# Patient Record
Sex: Female | Born: 1980 | Hispanic: No | Marital: Married | Smoking: Never smoker
Health system: Southern US, Community
[De-identification: ages and names within clinical notes are randomized; demographics above are authoritative.]

## PROBLEM LIST (undated history)

## (undated) DIAGNOSIS — R51 Headache: Secondary | ICD-10-CM

## (undated) DIAGNOSIS — T7840XA Allergy, unspecified, initial encounter: Secondary | ICD-10-CM

## (undated) DIAGNOSIS — R519 Headache, unspecified: Secondary | ICD-10-CM

## (undated) DIAGNOSIS — Z800 Family history of malignant neoplasm of digestive organs: Secondary | ICD-10-CM

## (undated) DIAGNOSIS — Z8051 Family history of malignant neoplasm of kidney: Secondary | ICD-10-CM

## (undated) HISTORY — DX: Family history of malignant neoplasm of kidney: Z80.51

## (undated) HISTORY — DX: Headache, unspecified: R51.9

## (undated) HISTORY — DX: Allergy, unspecified, initial encounter: T78.40XA

## (undated) HISTORY — DX: Headache: R51

## (undated) HISTORY — DX: Family history of malignant neoplasm of digestive organs: Z80.0

---

## 2016-04-25 ENCOUNTER — Encounter: Payer: Self-pay | Admitting: Certified Nurse Midwife

## 2016-04-25 ENCOUNTER — Ambulatory Visit (INDEPENDENT_AMBULATORY_CARE_PROVIDER_SITE_OTHER): Payer: PRIVATE HEALTH INSURANCE | Admitting: Certified Nurse Midwife

## 2016-04-25 VITALS — BP 116/72 | HR 72 | Resp 16 | Ht 67.25 in | Wt 138.0 lb

## 2016-04-25 DIAGNOSIS — M791 Myalgia, unspecified site: Secondary | ICD-10-CM

## 2016-04-25 DIAGNOSIS — N390 Urinary tract infection, site not specified: Secondary | ICD-10-CM

## 2016-04-25 DIAGNOSIS — Z0000 Encounter for general adult medical examination without abnormal findings: Secondary | ICD-10-CM

## 2016-04-25 LAB — POCT URINALYSIS DIPSTICK
BILIRUBIN UA: NEGATIVE
Glucose, UA: NEGATIVE
KETONES UA: NEGATIVE
Leukocytes, UA: NEGATIVE
Nitrite, UA: NEGATIVE
PH UA: 5
Protein, UA: NEGATIVE
RBC UA: NEGATIVE
Urobilinogen, UA: NEGATIVE

## 2016-04-25 MED ORDER — PHENAZOPYRIDINE HCL 100 MG PO TABS: 100.0000 mg | ORAL_TABLET | Freq: Three times a day (TID) | ORAL | 0 refills | Status: DC | PRN

## 2016-04-25 MED ORDER — NITROFURANTOIN MONOHYD MACRO 100 MG PO CAPS: 100.0000 mg | ORAL_CAPSULE | Freq: Two times a day (BID) | ORAL | 0 refills | Status: DC

## 2016-04-25 NOTE — Progress Notes (Signed)
35 y.o. G1P1001 Married  [place] eastern([place]) Fe here to establish gyn care and for problem only. Complaining of urinary frequency, urgency and end of stream dysuria and small amounts of urine at times. Denies fever or chills,or back pain, nausea or headache. Has been in swimming recently and feels this may have contributed to problem. No soda or tea use. Drinking water frequently. Also complaining of right chest wall pain after trying the hola hoop 3 weeks ago, using arms. for several hours. Denies change in breast appearance  or nipple discharge. No masses palpated Has breast tenderness with period and this feels different. Does SBE and has noted change or tenderness. No family history of breast cancer. Periods normal, no issues. Condoms work well for contraception. No other concerns today. No communication concerns.  Patient's last menstrual period was 04/02/2016.          Sexually active: Yes.    The current method of family planning is condoms all the time.    Exercising: No.  exercise Smoker:  no  Health Maintenance: Pap:  2013 neg, no abnormal pap smears MMG:  none Colonoscopy:  None (father died of colon cancer age 29) BMD:   none TDaP:  unsure Shingles: no Pneumonia: no Hep C and HIV: had done neg Labs: poct urine-neg, hgb-12.8 Self breast exam: not done   reports that she has never smoked. She has never used smokeless tobacco. She reports that she does not drink alcohol or use drugs.  History reviewed. No pertinent past medical history.  History reviewed. No pertinent surgical history.  No current outpatient prescriptions on file.   No current facility-administered medications for this visit.     History reviewed. No pertinent family history.  ROS:  Pertinent items are noted in HPI.  Otherwise, a comprehensive ROS was negative.  Exam:   BP 116/72   Pulse 72   Resp 16   Ht 5' 7.25" (1.708 m)   Wt 138 lb (62.6 kg)   LMP 04/02/2016   BMI 21.45 kg/m  Height: 5' 7.25"  (170.8 cm) Ht Readings from Last 3 Encounters:  04/25/16 5' 7.25" (1.708 m)    General appearance: alert, cooperative and appears stated age Lungs: clear to auscultation bilaterally CVAT: negative bilateral Breasts: normal appearance, no masses or tenderness, No nipple retraction or dimpling, No nipple discharge or bleeding, No axillary or supraclavicular adenopathy  Patient points to area outside breast on chest wall as point of pain and has pain with range of motion using right arm in chest wall area. No point of pain in breast area bilateral. Heart: regular rate and rhythm Abdomen: soft, non-tender; no masses, positive suprapubic,  no organomegaly Skin: Skin color, texture, turgor normal. No rashes or lesions, warm and dry Lymph nodes:  Axillary nodes normal. No abnormal inguinal nodes palpated Neurologic: Grossly normal   Pelvic: External genitalia:  no lesions, normal female              Urethra:  normal appearing urethra with no masses, tender  Bladder tender, Urethral meatus slightly red and tender              Bartholin's and Skene's: normal                 Vagina: normal appearing vagina with normal color and discharge, no lesions              Cervix: palpated normal, non tender  Pap taken: No. Bimanual Exam:  Uterus:  normal size, contour, position, consistency, mobility, non-tender              Adnexa: normal adnexa and no mass, fullness, tenderness                 Chaperone present: yes  A:  UTI with urethritis  Right chest wall muscle pain, probably due to exercise activity  Normal breast exam  Aex needed  P:   Reviewed findings of UTI and need for treatment.  Discussed risk of UTI and vaginal infection with staying in wet swim suit for long periods of time. Continue to increase water intake. Warning signs of UTI reviewed and need to advise. Will follow up at aex.  Rx Macrobid see order with instructions  Rx Pyridium see order with instructions Lab:  Urine culture   Discussed muscular pain with some activities and area she points to is chest wall muscle pain, rib area. No tenderness identified in breast bilateral. Normal breast exam.. Discussed slow range of motion exercise, heat to area and trial of Advil 600 mg every 6 hours for 48 hours to see if this relieved the problem. No heavy lifting or straining. Warning signs of chest pain given and needs to seek ER. Voiced understanding.   Plans to schedule aex prior to leaving and will follow up with chest wall muscle pain at aex.  Rv prn  An After Visit Summary was printed and given to the patient.

## 2016-04-25 NOTE — Patient Instructions (Signed)

## 2016-04-27 LAB — URINE CULTURE: ORGANISM ID, BACTERIA: NO GROWTH

## 2016-04-28 LAB — HEMOGLOBIN, FINGERSTICK: HEMOGLOBIN, FINGERSTICK: 12.8 g/dL (ref 12.0–16.0)

## 2016-04-28 NOTE — Progress Notes (Signed)
Encounter reviewed Haroldine Redler, MD   

## 2016-04-29 ENCOUNTER — Telehealth: Payer: Self-pay

## 2016-04-29 NOTE — Telephone Encounter (Signed)
-----   Message from Verner Chol, CNM sent at 04/28/2016  8:18 PM EDT ----- Notify urine culture is negative so urethritis the cause of discomfort Patient status

## 2016-04-29 NOTE — Telephone Encounter (Signed)
lmtcb

## 2016-04-29 NOTE — Telephone Encounter (Signed)
Patient notified of results. See lab 

## 2017-05-06 ENCOUNTER — Ambulatory Visit (INDEPENDENT_AMBULATORY_CARE_PROVIDER_SITE_OTHER): Payer: BLUE CROSS/BLUE SHIELD | Admitting: Family Medicine

## 2017-05-06 ENCOUNTER — Encounter: Payer: Self-pay | Admitting: Family Medicine

## 2017-05-06 VITALS — BP 126/80 | HR 99 | Resp 12 | Ht 67.25 in | Wt 145.5 lb

## 2017-05-06 DIAGNOSIS — G44229 Chronic tension-type headache, not intractable: Secondary | ICD-10-CM | POA: Diagnosis not present

## 2017-05-06 DIAGNOSIS — J069 Acute upper respiratory infection, unspecified: Secondary | ICD-10-CM | POA: Diagnosis not present

## 2017-05-06 DIAGNOSIS — G8929 Other chronic pain: Secondary | ICD-10-CM | POA: Diagnosis not present

## 2017-05-06 DIAGNOSIS — M549 Dorsalgia, unspecified: Secondary | ICD-10-CM

## 2017-05-06 DIAGNOSIS — G4700 Insomnia, unspecified: Secondary | ICD-10-CM | POA: Diagnosis not present

## 2017-05-06 LAB — BASIC METABOLIC PANEL
BUN: 11 mg/dL (ref 6–23)
CHLORIDE: 105 meq/L (ref 96–112)
CO2: 30 meq/L (ref 19–32)
CREATININE: 0.93 mg/dL (ref 0.40–1.20)
Calcium: 10 mg/dL (ref 8.4–10.5)
GFR: 72.42 mL/min (ref >60.00)
Glucose, Bld: 95 mg/dL (ref 70–99)
Potassium: 4.3 mEq/L (ref 3.5–5.1)
SODIUM: 139 meq/L (ref 135–145)

## 2017-05-06 LAB — CBC
HCT: 39 % (ref 36.0–46.0)
HEMOGLOBIN: 12.9 g/dL (ref 12.0–15.0)
MCHC: 33.2 g/dL (ref 30.0–36.0)
MCV: 88.7 fl (ref 78.0–100.0)
Platelets: 173 10*3/uL (ref 150.0–400.0)
RBC: 4.39 Mil/uL (ref 3.87–5.11)
RDW: 13.7 % (ref 11.5–15.5)
WBC: 5.3 10*3/uL (ref 4.0–10.5)

## 2017-05-06 LAB — TSH: TSH: 2.62 u[IU]/mL (ref 0.35–4.50)

## 2017-05-06 MED ORDER — AMITRIPTYLINE HCL 10 MG PO TABS: 10.0000 mg | ORAL_TABLET | Freq: Every day | ORAL | 1 refills | Status: DC

## 2017-05-06 NOTE — Patient Instructions (Addendum)
A few things to remember from today's visit:   URI, acute  Chronic tension-type headache, not intractable - Plan: amitriptyline (ELAVIL) 10 MG tablet, CBC, Basic metabolic panel, TSH, Ambulatory referral to Physical Therapy  Chronic upper back pain - Plan: Ambulatory referral to Physical Therapy   Please be sure medication list is accurate. If a new problem present, please set up appointment sooner than planned today.

## 2017-05-06 NOTE — Progress Notes (Signed)
HPI:   [name] is a 36 y.o. female, who is here today to establish care.  Former PCP: N/A Last preventive routine visit: 03/2016 she saw Dr Kathleen Ruiz, gyn.  She is from [place].  Chronic medical problems: Frequent headaches and UTI's.   Concerns today: Sore throat and headaches.  Headache: Hx of headache since she was a teenager. Usually she has headaches around her menstrual periods but for the past few months she has had them more frequent, 3-4 per months and for the past 2 weeks daily. Occipital, bitemporal,and parietal intermittent headache. Pressure like pain, 9/10 max,"too much pain." No associated nausea, vomiting, or visual changes. Sometimes she has associated photophobia.  She has not identified exacerbated factors but once she has headache it is aggravated by certain activities like bending down. Tylenol 2000 mg or Excedrin migraines do not help.  LMP 2 weeks ago. Denies unusual stress, anxiety,or depression. Denies fever,chills, or abnormal wt loss.  + Upper back and cervical pain, exacerbated by movement, 8-9/10. In [place] she was evaluated for back pain and PT was recommended, she completed 10 sections and cervical pain improved but no headaches.   No know Hx of anemia, reports heavy menses sometimes clots. She states that she has had iron checked during gyn visits.  She does not sleep well. She goes to bed around 11 pm and gets up at 7 am. She wakes up 3-4 times per night.  She lives with husband and 404 yo daughter.  Respiratory symptoms:  5-7 days of non productive cough and sore throat. Daughter had a cold recently. Cough worse at night. She denies wheezing,dyspnea,or chest pain. No recent travel.  No GERD like sx.     Review of Systems  Constitutional: Positive for fatigue. Negative for activity change, appetite change, fever and unexpected weight change.  HENT: Positive for sore throat. Negative for mouth sores, nosebleeds and  trouble swallowing.   Eyes: Negative for redness and visual disturbance.  Respiratory: Positive for cough. Negative for shortness of breath and wheezing.   Cardiovascular: Negative for chest pain, palpitations and leg swelling.  Gastrointestinal: Negative for abdominal pain, nausea and vomiting.       Negative for changes in bowel habits.  Endocrine: Negative for cold intolerance and heat intolerance.  Genitourinary: Negative for decreased urine volume, dysuria and hematuria.  Musculoskeletal: Positive for back pain and neck pain.  Skin: Negative for rash.  Allergic/Immunologic: Positive for environmental allergies.  Neurological: Positive for headaches. Negative for syncope, weakness and numbness.  Hematological: Negative for adenopathy. Does not bruise/bleed easily.  Psychiatric/Behavioral: Positive for sleep disturbance. Negative for confusion.     No current outpatient prescriptions on file prior to visit.   No current facility-administered medications on file prior to visit.     Past Medical History:  Diagnosis Date  . Allergy   . Headache    No Known Allergies  Family History  Problem Relation Age of Onset  . Colon cancer Father [id number]       died from colon cancer  . Cancer Father [id number]       colon  . Diabetes Neg Hx   . Hyperlipidemia Neg Hx   . Hypertension Neg Hx     Social History   Social History  . Marital status: Married    Spouse name: N/A  . Number of children: N/A  . Years of education: N/A   Social History Main Topics  . Smoking status: Never Smoker  .  Smokeless tobacco: Never Used  . Alcohol use No  . Drug use: No  . Sexual activity: Yes    Partners: Male    Birth control/ protection: Condom   Other Topics Concern  . None   Social History Narrative  . None    Vitals:   05/06/17 1004  BP: 126/80  Pulse: 99  Resp: 12  SpO2: 98%   Body mass index is 22.62 kg/m.   Physical Exam  Nursing note and vitals reviewed. Constitutional: She  is oriented to person, place, and time. She appears well-developed and well-nourished. No distress.  HENT:  Head: Normocephalic and atraumatic.  Mouth/Throat: Oropharynx is clear and moist and mucous membranes are normal.  Eyes: Pupils are equal, round, and reactive to light. Conjunctivae and EOM are normal.  Neck: No tracheal deviation present. No thyroid mass and no thyromegaly (palpable) present.  Cardiovascular: Normal rate and regular rhythm.   No murmur heard. Pulses:      Dorsalis pedis pulses are 2+ on the right side, and 2+ on the left side.  Respiratory: Effort normal and breath sounds normal. No respiratory distress.  GI: Soft. She exhibits no mass. There is no hepatomegaly. There is no tenderness.  Musculoskeletal: She exhibits no edema.       Cervical back: She exhibits tenderness. She exhibits normal range of motion.  Trapezium and paraspinal cervical tenderness, bilateral with mild muscle spasm. Also scalp tenderness: Occipital and parietal.  Lymphadenopathy:    She has no cervical adenopathy.  Neurological: She is alert and oriented to person, place, and time. She has normal strength. No cranial nerve deficit. Coordination and gait normal.  Reflex Scores:      Patellar reflexes are 2+ on the right side and 2+ on the left side. Skin: Skin is warm. No rash noted. No erythema.  Psychiatric: Her mood appears anxious.  Well groomed, good eye contact.     ASSESSMENT AND PLAN:   Ms. Kathleen Ruiz was seen today for establish care.  Diagnoses and all orders for this visit:  Lab Results  Component Value Date   WBC 5.3 05/06/2017   HGB 12.9 05/06/2017   HCT 39.0 05/06/2017   MCV 88.7 05/06/2017   PLT 173.0 05/06/2017   Lab Results  Component Value Date   TSH 2.62 05/06/2017   Lab Results  Component Value Date   CREATININE 0.93 05/06/2017   BUN 11 05/06/2017   NA 139 05/06/2017   K 4.3 05/06/2017   CL 105 05/06/2017   CO2 30 05/06/2017     Chronic tension-type  headache, not intractable   Hx suggest tension like headache. Migraine also to consider but symptoms are not typical. We discussed treatment options, after discussion of some side effects she agrees with trying Amitriptyline 10 mg. She can increase dose to 1.5 and 1 tabs as tolerated. She prefers to hold on head imaging for now Instructed about warning signs. F/U in 4 weeks.  -     amitriptyline (ELAVIL) 10 MG tablet; Take 1 tablet (10 mg total) by mouth at bedtime. -     CBC -     Basic metabolic panel -     TSH -     Ambulatory referral to Physical Therapy  Chronic upper back pain  Local massage and heat may help. PT evaluation/referral placed.  -     Ambulatory referral to Physical Therapy  URI, acute  Symptoms suggests a viral etiology, I explained patient that symptomatic treatment is usually recommended  in this case, so I do not think abx is needed at this time. Instructed to monitor for signs of complications, instructed about warning signs. F/U as needed.   Insomnia, unspecified type  Good sleep hygiene. Amitriptyline may help. F/U in 4 weeks.  -     amitriptyline (ELAVIL) 10 MG tablet; Take 1 tablet (10 mg total) by mouth at bedtime. -     TSH    Betty G. [place], MD  [facility]. [facility].

## 2017-05-09 ENCOUNTER — Encounter: Payer: Self-pay | Admitting: Family Medicine

## 2017-05-12 ENCOUNTER — Ambulatory Visit: Payer: BLUE CROSS/BLUE SHIELD | Attending: Family Medicine

## 2017-05-12 DIAGNOSIS — M546 Pain in thoracic spine: Secondary | ICD-10-CM | POA: Insufficient documentation

## 2017-05-12 DIAGNOSIS — M542 Cervicalgia: Secondary | ICD-10-CM | POA: Insufficient documentation

## 2017-05-12 DIAGNOSIS — G44229 Chronic tension-type headache, not intractable: Secondary | ICD-10-CM | POA: Diagnosis present

## 2017-05-12 DIAGNOSIS — R252 Cramp and spasm: Secondary | ICD-10-CM | POA: Insufficient documentation

## 2017-05-12 NOTE — Therapy (Signed)
[facility] [address], [address] [place], [place], [address] Phone: [phone]   Fax:  [phone]  Physical Therapy Evaluation  Patient Details  Name: Kathleen Ruiz MRN: [id number] Date of Birth: Oct 10, 1980 Referring Provider: [place], Betty, MD  Encounter Date: 05/12/2017      PT End of Session - 05/12/17 1241    Visit Number 1   Date for PT Re-Evaluation 07/07/17   PT Start Time 1147   PT Stop Time 1225   PT Time Calculation (min) 38 min   Activity Tolerance Patient tolerated treatment well   Behavior During Therapy [facility] for tasks assessed/performed      Past Medical History:  Diagnosis Date  . Allergy   . Headache     No past surgical history on file.  There were no vitals filed for this visit.       Subjective Assessment - 05/12/17 1151    Subjective Pt reports a chronic history of neck pain and chronic tension type headache over the past 10 years. Pt with long stretch of headaches over the past 2 weeks.    Diagnostic tests none   Patient Stated Goals reduce headaches, reduce thoracic/neck pain   Currently in Pain? Yes   Pain Score 7    Pain Location Neck   Pain Orientation Right;Left   Pain Descriptors / Indicators Aching;Headache;Tightness;Cramping   Pain Type Chronic pain   Pain Onset More than a month ago   Pain Frequency Constant   Aggravating Factors  sleep at night, sometimes random   Pain Relieving Factors unknown, pain medicine at time            [facility] PT Assessment - 05/12/17 0001      Assessment   Medical Diagnosis chronic tension-type headache, chronic upper back pain   Referring Provider [place], Betty, MD   Onset Date/Surgical Date 05/13/15   Next MD Visit 05/2017   Prior Therapy none     Precautions   Precautions None     Restrictions   Weight Bearing Restrictions No     Balance Screen   Has the patient fallen in the past 6 months No   Has the patient had a decrease  in activity level because of a fear of falling?  No   Is the patient reluctant to leave their home because of a fear of falling?  No     Home [employer]ist information centre manager residence     Prior Function   Level of Independence Independent   Vocation Unemployed   Leisure none     Cognition   Overall Cognitive Status Within Functional Limits for tasks assessed     Observation/Other Assessments   Focus on Therapeutic Outcomes (FOTO)  26% limitation     Posture/Postural Control   Posture/Postural Control Postural limitations   Postural Limitations Rounded Shoulders;Forward head     ROM / Strength   AROM / PROM / Strength AROM;PROM;Strength     AROM   Overall AROM  Within functional limits for tasks performed   Overall AROM Comments full cervical A/ROM with stiffness and pain reported at end range of each motion. UE A/ROM is full.       PROM   Overall PROM  Within functional limits for tasks performed     Strength   Overall Strength Deficits   Overall Strength Comments difficulty sitting with upright posture due to core and thoracic weakness     Palpation   Spinal mobility  reduced PA mobility in thoracic and cervical spine with pain C3-8   Palpation comment trigger points in bil suboccipitals, upper traps and tension in bil. cervical paraspinals.     Transfers   Transfers Independent with all Transfers     Ambulation/Gait   Gait Pattern Within Functional Limits            Objective measurements completed on examination: See above findings.                  PT Education - 05/12/17 1220    Education provided Yes   Education Details dry needling, posture, cervical A/ROM   Person(s) Educated Patient   Methods Explanation;Handout;Demonstration   Comprehension Verbalized understanding;Returned demonstration          PT Short Term Goals - 05/12/17 1147      PT SHORT TERM GOAL #1   Title be independent in initial HEP   Time 4   Period  Weeks   Status New     PT SHORT TERM GOAL #2   Title report a 25% reduction in the frequency and intensity of headaches   Time 4   Period Weeks   Status New     PT SHORT TERM GOAL #3   Title report < or = to 5/10 neck and  thoracic pain with daily activities   Time 4   Period Weeks   Status New           PT Long Term Goals - 05/12/17 1147      PT LONG TERM GOAL #1   Title be independent in advanced HEP   Time 8   Period Weeks   Status New     PT LONG TERM GOAL #2   Title reduce FOTO to < or = to 13% limitation   Time 8   Period Weeks   Status New     PT LONG TERM GOAL #3   Title report a 60% reduction in the frequency and intensity of headaches   Time 8   Period Weeks   Status New     PT LONG TERM GOAL #4   Title report no sleep interrruptions due to pain   Time 8   Period Weeks   Status New     PT LONG TERM GOAL #5   Title report < or = to 3/10 thoracic and neck pain with daily activities   Time 8   Period Weeks   Status New                Plan - 05/12/17 1227    Clinical Impression Statement Pt presents to PT with chronic history of tension headaches and neck pain with recent onset of thoracic pain.  Pt reports 7/10 headache pain that is sometimes daily and constant.  Pt reports sleep interruptions due to pain.  Pt demonstrates rounded shoulder and forward head posture, weak abdominals and thoracic musculature.  Pt with tension and trigger points over suboccipitals, upper traps and cervical paraspinals.  Pt will benefit from skilled PT for dry needling/manual, postural strength, core strength and modalities as needed.     History and Personal Factors relevant to plan of care: none   Clinical Presentation Stable   Clinical Decision Making Low   Rehab Potential Good   PT Frequency 2x / week   PT Duration 8 weeks   PT Treatment/Interventions ADLs/Self Care Home Management;Cryotherapy;Electrical Stimulation;Functional mobility training;Moist  Heat;Traction;Ultrasound;Patient/family education;Therapeutic activities;Therapeutic exercise;Prosthetic Training;Manual techniques;Dry needling;Taping;Passive range of  motion   PT Next Visit Plan dry needling to neck and thoracic spine, postural strength, manual, modalities, suboccipital release   Consulted and Agree with Plan of Care Patient      Patient will benefit from skilled therapeutic intervention in order to improve the following deficits and impairments:  Postural dysfunction, Decreased strength, Pain, Decreased activity tolerance, Increased muscle spasms, Decreased range of motion  Visit Diagnosis: Cervicalgia - Plan: PT plan of care cert/re-cert  Pain in thoracic spine - Plan: PT plan of care cert/re-cert  Chronic tension-type headache, not intractable - Plan: PT plan of care cert/re-cert  Cramp and spasm - Plan: PT plan of care cert/re-cert     Problem List Patient Active Problem List   Diagnosis Date Noted  . Tension headache, chronic 05/06/2017  . Chronic upper back pain 05/06/2017  . Insomnia 05/06/2017     Lorrene ReidKelly Breckyn Ticas, PT 05/12/17 12:44 PM  [facility] [address], [address] [place], [place], [address] Phone: [phone]   Fax:  [phone]  Name: Kathleen Ruiz MRN: [id number] Date of Birth: December 23, 1980

## 2017-05-12 NOTE — Patient Instructions (Signed)
PERFORM ALL EXERCISES GENTLY AND WITH GOOD POSTURE.    20 SECOND HOLD, 3 REPS TO EACH SIDE. 4-5 TIMES EACH DAY.   AROM: Neck Rotation   Turn head slowly to look over one shoulder, then the other.   AROM: Neck Flexion   Bend head forward.   AROM: Lateral Neck Flexion   Slowly tilt head toward one shoulder, then the other.    Posture - Standing   Good posture is important. Avoid slouching and forward head thrust. Maintain curve in low back and align ears over shoulders, hips over ankles.  Pull your belly button in toward your back bone. Posture Tips DO: - stand tall and erect - keep chin tucked in - keep head and shoulders in alignment - check posture regularly in mirror or large window - pull head back against headrest in car seat;  Change your position often.  Sit with lumbar support. DON'T: - slouch or slump while watching TV or reading - sit, stand or lie in one position  for too long;  Sitting is especially hard on the spine so if you sit at a desk/use the computer, then stand up often! Copyright  VHI. All rights reserved.  Posture - Sitting  Sit upright, head facing forward. Try using a roll to support lower back. Keep shoulders relaxed, and avoid rounded back. Keep hips level with knees. Avoid crossing legs for long periods. Copyright  VHI. All rights reserved.  Chronic neck strain can develop because of poor posture and faulty work habits  Postural strain related to slumped sitting and forward head posture is a leading cause of headaches, neck and upper back pain  General strengthening and flexibility exercises are helpful in the treatment of neck pain.  Most importantly, you should learn to correct the posture that may be contributing to chronic pain.   Change positions frequently  Change your work or home environment to improve posture and mechanics.   [facility] [address], [address] [place], [place] [address] Phone # [phone] Fax  [phone] 

## 2017-05-14 ENCOUNTER — Encounter: Payer: Self-pay | Admitting: Physical Therapy

## 2017-05-14 ENCOUNTER — Ambulatory Visit: Payer: BLUE CROSS/BLUE SHIELD | Admitting: Physical Therapy

## 2017-05-14 DIAGNOSIS — M542 Cervicalgia: Secondary | ICD-10-CM

## 2017-05-14 DIAGNOSIS — R252 Cramp and spasm: Secondary | ICD-10-CM

## 2017-05-14 DIAGNOSIS — M546 Pain in thoracic spine: Secondary | ICD-10-CM

## 2017-05-14 DIAGNOSIS — G44229 Chronic tension-type headache, not intractable: Secondary | ICD-10-CM

## 2017-05-14 NOTE — Therapy (Signed)
[facility] [address] [place], [place], [address] Phone: [phone]   Fax:  [phone]  Physical Therapy Treatment  Patient Details  Name: Kathleen Ruiz MRN: [id number] Date of Birth: 20-Nov-1980 Referring Provider: [place], Betty, MD  Encounter Date: 05/14/2017      PT End of Session - 05/14/17 1149    Visit Number 2   Date for PT Re-Evaluation 07/07/17   PT Start Time 1145   PT Stop Time 1225   PT Time Calculation (min) 40 min   Activity Tolerance Patient tolerated treatment well   Behavior During Therapy [facility] for tasks assessed/performed      Past Medical History:  Diagnosis Date  . Allergy   . Headache     History reviewed. No pertinent surgical history.  There were no vitals filed for this visit.      Subjective Assessment - 05/14/17 1148    Subjective I am not having a headache today due to using medication.    Diagnostic tests none   Patient Stated Goals reduce headaches, reduce thoracic/neck pain   Currently in Pain? No/denies   Multiple Pain Sites No                         [facility] Adult PT Treatment/Exercise - 05/14/17 0001      Manual Therapy   Manual Therapy Soft tissue mobilization;Joint mobilization   Joint Mobilization C6-T2 in sitting to open up the facets   Soft tissue mobilization suboccipitals, upper trap, cervical and thoracic paraspinals          Trigger Point Dry Needling - 05/14/17 1150    Consent Given? Yes   Education Handout Provided Yes   Muscles Treated Upper Body Oblique capitus;Suboccipitals muscle group  thoracic multifidi T1-T6; Cervical multifidi C3-C7   Oblique Capitus Response Twitch response elicited;Palpable increased muscle length   SubOccipitals Response Twitch response elicited;Palpable increased muscle length              PT Education - 05/14/17 1227    Education provided Yes   Education Details suboccipital release   Person(s) Educated Patient   Methods Explanation;Demonstration;Verbal cues;Handout   Comprehension Returned demonstration;Verbalized understanding          PT Short Term Goals - 05/12/17 1147      PT SHORT TERM GOAL #1   Title be independent in initial HEP   Time 4   Period Weeks   Status New     PT SHORT TERM GOAL #2   Title report a 25% reduction in the frequency and intensity of headaches   Time 4   Period Weeks   Status New     PT SHORT TERM GOAL #3   Title report < or = to 5/10 neck and  thoracic pain with daily activities   Time 4   Period Weeks   Status New           PT Long Term Goals - 05/12/17 1147      PT LONG TERM GOAL #1   Title be independent in advanced HEP   Time 8   Period Weeks   Status New     PT LONG TERM GOAL #2   Title reduce FOTO to < or = to 13% limitation   Time 8   Period Weeks   Status New     PT LONG TERM GOAL #3   Title report a 60% reduction in the frequency  and intensity of headaches   Time 8   Period Weeks   Status New     PT LONG TERM GOAL #4   Title report no sleep interrruptions due to pain   Time 8   Period Weeks   Status New     PT LONG TERM GOAL #5   Title report < or = to 3/10 thoracic and neck pain with daily activities   Time 8   Period Weeks   Status New               Plan - 05/14/17 1227    Clinical Impression Statement Patient had many trigger points in the subocciptials and cervical/thoracic multifidi.  Patient has tightness in C6-T3.  Patient neck muscles felt fatiqued after therapy.  Patient will benefit from skilled therapy for dry needling/manual, postural strenght, core strength and modalitiies as needed.    Rehab Potential Good   PT Frequency 2x / week   PT Duration 8 weeks   PT Treatment/Interventions ADLs/Self Care Home Management;Cryotherapy;Electrical Stimulation;Functional mobility training;Moist Heat;Traction;Ultrasound;Patient/family education;Therapeutic activities;Therapeutic  exercise;Prosthetic Training;Manual techniques;Dry needling;Taping;Passive range of motion   PT Next Visit Plan dry needling to neck and thoracic spine, postural strength, manual, modalities, suboccipital release   PT Home Exercise Plan progress as needed   Recommended Other Services MD signed initial summary on 05/12/2017   Consulted and Agree with Plan of Care Patient      Patient will benefit from skilled therapeutic intervention in order to improve the following deficits and impairments:  Postural dysfunction, Decreased strength, Pain, Decreased activity tolerance, Increased muscle spasms, Decreased range of motion  Visit Diagnosis: Cervicalgia  Pain in thoracic spine  Chronic tension-type headache, not intractable  Cramp and spasm     Problem List Patient Active Problem List   Diagnosis Date Noted  . Tension headache, chronic 05/06/2017  . Chronic upper back pain 05/06/2017  . Insomnia 05/06/2017    Eulis Fosterheryl Arlin Sass, PT 05/14/17 12:30 PM   [facility] [address] [place], [place], [address] Phone: [phone]   Fax:  [phone]  Name: Donzetta KohutSomayeh Hernon MRN: [id number] Date of Birth: 28-Nov-1980

## 2017-05-14 NOTE — Patient Instructions (Signed)
   Picture 1: - Start by placing the webspace of your hand on the front of the chin just below the mouth with palm up towards the ceiling (use hand of side you are going to stretch) - Then put other hand on the back and upper part of the skull on the side you are stretching  Picture 2: - Sit/Stand up tall and push your hand on your chin straight back while the hand on the top of your head pulls upward, tucking your chin  Picture 3: - Holding the position from Picture 2, use both hands to tilt your head away from the side to be stretch - Pretend that you are rotating your head on a perfect axis as if a pin were running through the front of your nose directly through the back of your head  Picture 4: - Holding the position from Picture 3, continue to keep your keep pressure constant with both hands as you slowly rotate your head towards the involved side to be stretched - You should feel a stretch on the side you turn your head towards, at the place where your skull meets the rest of your neck  - Hold this stretch for the prescribed time and then release stretch - Perform for prescribed repetitions [facility] [address], [address] [place], [place] [address] Phone # [phone] Fax [phone]

## 2017-05-20 ENCOUNTER — Ambulatory Visit: Payer: BLUE CROSS/BLUE SHIELD

## 2017-05-20 DIAGNOSIS — G44229 Chronic tension-type headache, not intractable: Secondary | ICD-10-CM

## 2017-05-20 DIAGNOSIS — M546 Pain in thoracic spine: Secondary | ICD-10-CM

## 2017-05-20 DIAGNOSIS — M542 Cervicalgia: Secondary | ICD-10-CM

## 2017-05-20 DIAGNOSIS — R252 Cramp and spasm: Secondary | ICD-10-CM

## 2017-05-20 NOTE — Therapy (Signed)
[facility] [address], [address] [place], [place], [address] Phone: [phone]   Fax:  [phone]  Physical Therapy Treatment  Patient Details  Name: Kathleen Ruiz MRN: [id number] Date of Birth: [birth year] Referring Provider: [place], Betty, MD  Encounter Date: 05/20/2017      PT End of Session - 05/20/17 1443    Visit Number 3   Date for PT Re-Evaluation 07/07/17   PT Start Time 1403   PT Stop Time 1456   PT Time Calculation (min) 53 min   Activity Tolerance Patient tolerated treatment well   Behavior During Therapy [facility] for tasks assessed/performed      Past Medical History:  Diagnosis Date  . Allergy   . Headache     History reviewed. No pertinent surgical history.  There were no vitals filed for this visit.      Subjective Assessment - 05/20/17 1409    Subjective I felt a little sore after dry needling last time for about a day.  I felt better after that.  I'm not ready to dry needle again today.  I want to do next time.     Patient Stated Goals reduce headaches, reduce thoracic/neck pain   Currently in Pain? Yes   Pain Score 3    Pain Location Thoracic   Pain Orientation Right;Left   Pain Descriptors / Indicators Aching;Tightness;Cramping   Pain Type Chronic pain   Pain Onset More than a month ago   Pain Frequency Constant   Aggravating Factors  sleep at night, sometimes random   Pain Relieving Factors pain medication sometimes                         [facility] Adult PT Treatment/Exercise - 05/20/17 0001      Exercises   Exercises Neck;Shoulder     Neck Exercises: Machines for Strengthening   UBE (Upper Arm Bike) Level 1 x 6 minutes (3/3)  PT present to discuss progress and provide postural cues     Shoulder Exercises: Supine   Horizontal ABduction Strengthening;Both;20 reps;Theraband   Theraband Level (Shoulder Horizontal ABduction) Level 2 (Red)   External Rotation  Strengthening;Both;20 reps;Theraband   Theraband Level (Shoulder External Rotation) Level 2 (Red)   Flexion Strengthening;Both;20 reps;Theraband  D2   Theraband Level (Shoulder Flexion) Level 2 (Red)     Modalities   Modalities Moist Heat     Moist Heat Therapy   Number Minutes Moist Heat 12 Minutes   Moist Heat Location Cervical     Manual Therapy   Manual Therapy Soft tissue mobilization;Joint mobilization   Soft tissue mobilization suboccipitals, upper trap, cervical and thoracic paraspinals     Neck Exercises: Stretches   Other Neck Stretches cervical A/ROM 3 ways: 20 seconds x 3                   PT Short Term Goals - 05/20/17 1414      PT SHORT TERM GOAL #1   Title be independent in initial HEP   Baseline cervical A/ROM, initiated strength exercises today   Time 4   Period Weeks   Status On-going     PT SHORT TERM GOAL #2   Title report a 25% reduction in the frequency and intensity of headaches   Baseline 50% less intensity   Time 4   Period Weeks   Status On-going     PT SHORT TERM GOAL #3   Title report <  or = to 5/10 neck and  thoracic pain with daily activities   Time 4   Period Weeks   Status On-going           PT Long Term Goals - 05/12/17 1147      PT LONG TERM GOAL #1   Title be independent in advanced HEP   Time 8   Period Weeks   Status New     PT LONG TERM GOAL #2   Title reduce FOTO to < or = to 13% limitation   Time 8   Period Weeks   Status New     PT LONG TERM GOAL #3   Title report a 60% reduction in the frequency and intensity of headaches   Time 8   Period Weeks   Status New     PT LONG TERM GOAL #4   Title report no sleep interrruptions due to pain   Time 8   Period Weeks   Status New     PT LONG TERM GOAL #5   Title report < or = to 3/10 thoracic and neck pain with daily activities   Time 8   Period Weeks   Status New               Plan - 05/20/17 1415    Clinical Impression Statement Pt  declined dry needling today although she reports that it helped last week.  She wants to do next week.  Pt is making postural corrections at home and is performing cervical A/ROM exercises.  Pt tolerated posutral strength exercises well today.  Pt with tension and trigger points in suboccipitals and cervical paraspinals and upper traps today and responded well to manual therapy today.  Pt will continue to benefit from skilled PT for manual, dry needling, flexibility and postural strength.     Rehab Potential Good   PT Frequency 2x / week   PT Duration 8 weeks   PT Treatment/Interventions ADLs/Self Care Home Management;Cryotherapy;Electrical Stimulation;Functional mobility training;Moist Heat;Traction;Ultrasound;Patient/family education;Therapeutic activities;Therapeutic exercise;Prosthetic Training;Manual techniques;Dry needling;Taping;Passive range of motion   PT Next Visit Plan dry needling to neck and thoracic spine, postural strength, manual, modalities, suboccipital release.  Isuue scapular theraband if tolerated well today.     Consulted and Agree with Plan of Care Patient      Patient will benefit from skilled therapeutic intervention in order to improve the following deficits and impairments:  Postural dysfunction, Decreased strength, Pain, Decreased activity tolerance, Increased muscle spasms, Decreased range of motion  Visit Diagnosis: Cervicalgia  Pain in thoracic spine  Chronic tension-type headache, not intractable  Cramp and spasm     Problem List Patient Active Problem List   Diagnosis Date Noted  . Tension headache, chronic 05/06/2017  . Chronic upper back pain 05/06/2017  . Insomnia 05/06/2017    Lorrene Reid, PT 05/20/17 2:45 PM  [facility] [address], [address] [place], [place], [address] Phone: [phone]   Fax:  [phone]  Name: Kathleen Ruiz MRN: [id number] Date of Birth: [birth year]

## 2017-05-25 ENCOUNTER — Ambulatory Visit: Payer: BLUE CROSS/BLUE SHIELD | Admitting: Physical Therapy

## 2017-05-25 ENCOUNTER — Encounter: Payer: Self-pay | Admitting: Physical Therapy

## 2017-05-25 DIAGNOSIS — R252 Cramp and spasm: Secondary | ICD-10-CM

## 2017-05-25 DIAGNOSIS — M542 Cervicalgia: Secondary | ICD-10-CM

## 2017-05-25 DIAGNOSIS — M546 Pain in thoracic spine: Secondary | ICD-10-CM

## 2017-05-25 NOTE — Therapy (Signed)
[facility] [address], [place], [place], [address] Phone: [phone]   Fax:  [phone]  Physical Therapy Treatment  Patient Details  Name: Kathleen Ruiz MRN: [id number] Date of Birth: 03-20-1981 Referring Provider: [place], Betty, MD  Encounter Date: 05/25/2017      PT End of Session - 05/25/17 1220    Visit Number 4   Date for PT Re-Evaluation 07/07/17   PT Start Time 8937   PT Stop Time 1240   PT Time Calculation (min) 55 min   Activity Tolerance Patient tolerated treatment well   Behavior During Therapy [facility] for tasks assessed/performed      Past Medical History:  Diagnosis Date  . Allergy   . Headache     History reviewed. No pertinent surgical history.  There were no vitals filed for this visit.      Subjective Assessment - 05/25/17 1149    Subjective I want to wait for dry needling.  I have a busy day today.    Diagnostic tests none   Patient Stated Goals reduce headaches, reduce thoracic/neck pain   Currently in Pain? Yes   Pain Score 4    Pain Location Thoracic   Pain Orientation Right;Left   Pain Descriptors / Indicators Aching;Tightness;Cramping   Pain Type Chronic pain   Pain Onset More than a month ago   Pain Frequency Constant   Aggravating Factors  sleep at night, sometimes random   Pain Relieving Factors pain  medication sometimes   Multiple Pain Sites No                         [facility] Adult PT Treatment/Exercise - 05/25/17 0001      Self-Care   Self-Care Other Self-Care Comments   Other Self-Care Comments  discussed with patient on swimming 2 times per week and looking into yoga     Neck Exercises: Machines for Strengthening   UBE (Upper Arm Bike) Level 1 x 6 minutes (3/3)  PT present to discuss progress and provide postural cues     Modalities   Modalities Electrical Stimulation;Moist Heat     Moist Heat Therapy   Number Minutes Moist Heat 15  Minutes   Moist Heat Location Cervical     Electrical Stimulation   Electrical Stimulation Location cervical upper back   Electrical Stimulation Action IFC   Electrical Stimulation Parameters to patient tolerance, 15 min   Electrical Stimulation Goals Pain     Manual Therapy   Manual Therapy Soft tissue mobilization   Soft tissue mobilization suboccipitals, upper trap, cervical and thoracic paraspinals                PT Education - 05/25/17 1204    Education provided Yes   Education Details interscapular strengthening with yellow band   Person(s) Educated Patient   Methods Explanation;Demonstration;Verbal cues;Handout   Comprehension Returned demonstration;Verbalized understanding          PT Short Term Goals - 05/25/17 1150      PT SHORT TERM GOAL #1   Title be independent in initial HEP   Time 4   Period Weeks   Status Achieved     PT SHORT TERM GOAL #2   Title report a 25% reduction in the frequency and intensity of headaches   Time 4   Period Weeks   Status Achieved     PT SHORT TERM GOAL #3   Title report < or =  to 5/10 neck and  thoracic pain with daily activities   Period Weeks   Status Achieved           PT Long Term Goals - 05/12/17 1147      PT LONG TERM GOAL #1   Title be independent in advanced HEP   Time 8   Period Weeks   Status New     PT LONG TERM GOAL #2   Title reduce FOTO to < or = to 13% limitation   Time 8   Period Weeks   Status New     PT LONG TERM GOAL #3   Title report a 60% reduction in the frequency and intensity of headaches   Time 8   Period Weeks   Status New     PT LONG TERM GOAL #4   Title report no sleep interrruptions due to pain   Time 8   Period Weeks   Status New     PT LONG TERM GOAL #5   Title report < or = to 3/10 thoracic and neck pain with daily activities   Time 8   Period Weeks   Status New               Plan - 05/25/17 1153    Clinical Impression Statement Patient has met  her STG's.  Her pain is overall 25% better.  She is not wanting dry needling today due to having a busy day. Patient has tightness located in cervical paraspinals.  Patient reports more pain in the past 2 days.  Patient will benefit from skilled therapy to reduce pain and improve function.    Rehab Potential Good   PT Frequency 2x / week   PT Duration 8 weeks   PT Next Visit Plan dry needling to neck and thoracic spine, postural strength, manual, modalities, suboccipital release.  discuss exercises to do at gym   PT Home Exercise Plan progress as needed   Consulted and Agree with Plan of Care Patient      Patient will benefit from skilled therapeutic intervention in order to improve the following deficits and impairments:  Postural dysfunction, Decreased strength, Pain, Decreased activity tolerance, Increased muscle spasms, Decreased range of motion  Visit Diagnosis: Cervicalgia  Pain in thoracic spine  Cramp and spasm     Problem List Patient Active Problem List   Diagnosis Date Noted  . Tension headache, chronic 05/06/2017  . Chronic upper back pain 05/06/2017  . Insomnia 05/06/2017    Earlie Counts, PT 05/25/17 12:23 PM   [facility] [address] [place], [place], [address] Phone: [phone]   Fax:  [phone]  Name: Gowri Suchan MRN: [id number] Date of Birth: 1981-09-10

## 2017-05-25 NOTE — Patient Instructions (Addendum)
  PNF Strengthening: Resisted    Lay on back with resistive band around each hand, bring right arm up and away, thumb back. Repeat _10___ times per set. Do _2___ sets per session. Do _1-2___ sessions per day.  Resisted Horizontal Abduction: Bilateral   Lay on back, tubing in both hands, arms out in front. Keeping arms straight, pinch shoulder blades together and stretch arms out. Repeat _10___ times per set. Do 2____ sets per session. Do _1-2___ sessions per day.  Scapular Retraction: Elbow Flexion (Standing)   Hold yellow band while laying on back. With elbows bent to 90, pinch shoulder blades together and rotate arms out, keeping elbows bent. Repeat _10___ times per set. Do _1___ sets per session. Do many____ sessions per day.  [facility] [address], [address] [place], [place] [address] Phone # [phone] Fax [phone]

## 2017-05-27 ENCOUNTER — Ambulatory Visit: Payer: BLUE CROSS/BLUE SHIELD | Admitting: Physical Therapy

## 2017-05-27 ENCOUNTER — Encounter: Payer: Self-pay | Admitting: Physical Therapy

## 2017-05-27 DIAGNOSIS — R252 Cramp and spasm: Secondary | ICD-10-CM

## 2017-05-27 DIAGNOSIS — M546 Pain in thoracic spine: Secondary | ICD-10-CM

## 2017-05-27 DIAGNOSIS — G44229 Chronic tension-type headache, not intractable: Secondary | ICD-10-CM

## 2017-05-27 DIAGNOSIS — M542 Cervicalgia: Secondary | ICD-10-CM | POA: Diagnosis not present

## 2017-05-27 NOTE — Therapy (Signed)
[facility] [address], [address] [place], [place], [address] Phone: [phone]   Fax:  [phone]  Physical Therapy Treatment  Patient Details  Name: Kathleen KohutSomayeh Ruiz MRN: [id number] Date of Birth: 04-02-1981 Referring Provider: [place], Betty, MD  Encounter Date: 05/27/2017      PT End of Session - 05/27/17 1137    Visit Number 5   Date for PT Re-Evaluation 07/07/17   Authorization Type [employer]   PT Start Time 1100   PT Stop Time 1155   PT Time Calculation (min) 55 min   Activity Tolerance Patient tolerated treatment well   Behavior During Therapy [facility] for tasks assessed/performed      Past Medical History:  Diagnosis Date  . Allergy   . Headache     History reviewed. No pertinent surgical history.  There were no vitals filed for this visit.      Subjective Assessment - 05/27/17 1106    Subjective I still do not want dry needling. Last visit helped.    Diagnostic tests none   Patient Stated Goals reduce headaches, reduce thoracic/neck pain   Currently in Pain? No/denies   Multiple Pain Sites No                         [facility] Adult PT Treatment/Exercise - 05/27/17 0001      Neck Exercises: Machines for Strengthening   UBE (Upper Arm Bike) Level 1 x 6 minutes (3/3)  PT present to discuss progress and provide postural cues     Shoulder Exercises: Supine   Horizontal ABduction Strengthening;Both;10 reps;Theraband   Theraband Level (Shoulder Horizontal ABduction) Level 2 (Red)   External Rotation Strengthening;Both;10 reps;Theraband   Theraband Level (Shoulder External Rotation) Level 2 (Red)   Flexion Strengthening;10 reps;Theraband;Left;Right   Theraband Level (Shoulder Flexion) Level 2 (Red)   Flexion Limitations VC to go slowly   Other Supine Exercises y motion with red band 10x wiht head press     Shoulder Exercises: Prone   Retraction Strengthening;10 reps   Retraction Limitations  therapist hand on mid thoracic to push into therapist hand     Modalities   Modalities Electrical Stimulation;Moist Heat     Moist Heat Therapy   Number Minutes Moist Heat 15 Minutes   Moist Heat Location Cervical  supine     Electrical Stimulation   Electrical Stimulation Location cervical upper back  supine   Electrical Stimulation Action IFC   Electrical Stimulation Parameters To patient tolerance, 15 min   Electrical Stimulation Goals Pain     Manual Therapy   Manual Therapy Soft tissue mobilization   Soft tissue mobilization upper thoracic and cervical paraspinals, suboccitals, scalenes, left SCM                  PT Short Term Goals - 05/25/17 1150      PT SHORT TERM GOAL #1   Title be independent in initial HEP   Time 4   Period Weeks   Status Achieved     PT SHORT TERM GOAL #2   Title report a 25% reduction in the frequency and intensity of headaches   Time 4   Period Weeks   Status Achieved     PT SHORT TERM GOAL #3   Title report < or = to 5/10 neck and  thoracic pain with daily activities   Period Weeks   Status Achieved  PT Long Term Goals - 05/27/17 1107      PT LONG TERM GOAL #1   Title be independent in advanced HEP   Time 8   Period Weeks   Status On-going     PT LONG TERM GOAL #2   Title reduce FOTO to < or = to 13% limitation   Time 8   Period Weeks   Status New     PT LONG TERM GOAL #3   Title report a 60% reduction in the frequency and intensity of headaches   Time 8   Period Weeks   Status On-going  25% better     PT LONG TERM GOAL #4   Title report no sleep interrruptions due to pain   Time 8   Period Weeks   Status On-going  still gets headaches at night     PT LONG TERM GOAL #5   Title report < or = to 3/10 thoracic and neck pain with daily activities   Time 8   Period Weeks   Status On-going  pain level 5/10               Plan - 05/27/17 1138    Clinical Impression Statement During  daily activities the thoracic pain is 6/10. Patient headaches improved by 25%.  Today patient is not having a headache or pain.  Patient has increased tightness in left suboccipitals and scalenes.  Patient will benefit from skilled therapy to reduce pain and improve function.    Rehab Potential Good   PT Frequency 2x / week   PT Duration 8 weeks   PT Treatment/Interventions ADLs/Self Care Home Management;Cryotherapy;Electrical Stimulation;Functional mobility training;Moist Heat;Traction;Ultrasound;Patient/family education;Therapeutic activities;Therapeutic exercise;Prosthetic Training;Manual techniques;Dry needling;Taping;Passive range of motion   PT Next Visit Plan dry needling to neck and thoracic spine, postural strength, manual, modalities, suboccipital release.  discuss exercises to do at gym   PT Home Exercise Plan progress as needed   Consulted and Agree with Plan of Care Patient      Patient will benefit from skilled therapeutic intervention in order to improve the following deficits and impairments:  Postural dysfunction, Decreased strength, Pain, Decreased activity tolerance, Increased muscle spasms, Decreased range of motion  Visit Diagnosis: Cervicalgia  Pain in thoracic spine  Cramp and spasm  Chronic tension-type headache, not intractable     Problem List Patient Active Problem List   Diagnosis Date Noted  . Tension headache, chronic 05/06/2017  . Chronic upper back pain 05/06/2017  . Insomnia 05/06/2017    Eulis Foster, PT 05/27/17 11:41 AM   [facility] [address] [place], [place], [address] Phone: [phone]   Fax:  [phone]  Name: Kathleen Ruiz MRN: [id number] Date of Birth: [birth year]

## 2017-06-02 ENCOUNTER — Ambulatory Visit: Payer: BLUE CROSS/BLUE SHIELD | Attending: Family Medicine | Admitting: Physical Therapy

## 2017-06-02 DIAGNOSIS — G44229 Chronic tension-type headache, not intractable: Secondary | ICD-10-CM

## 2017-06-02 DIAGNOSIS — M542 Cervicalgia: Secondary | ICD-10-CM | POA: Insufficient documentation

## 2017-06-02 DIAGNOSIS — M546 Pain in thoracic spine: Secondary | ICD-10-CM | POA: Diagnosis present

## 2017-06-02 DIAGNOSIS — R252 Cramp and spasm: Secondary | ICD-10-CM | POA: Insufficient documentation

## 2017-06-02 NOTE — Therapy (Signed)
[facility] [address] [place], [place], [address] Phone: [phone]   Fax:  [phone]  Physical Therapy Treatment  Patient Details  Name: Kathleen Ruiz MRN: [id number] Date of Birth: 06-02-1981 Referring Provider: Martinique, Betty, MD  Encounter Date: 06/02/2017      PT End of Session - 06/02/17 1410    Visit Number 6   Date for PT Re-Evaluation 07/07/17   Authorization Type BCBS   PT Start Time 1404   PT Stop Time 9295   PT Time Calculation (min) 43 min   Activity Tolerance Patient tolerated treatment well;No increased pain   Behavior During Therapy WFL for tasks assessed/performed      Past Medical History:  Diagnosis Date  . Allergy   . Headache     No past surgical history on file.  There were no vitals filed for this visit.      Subjective Assessment - 06/02/17 1407    Subjective Pt reports that she has not had any headaches or pain since last weekend. She states her exercises are going well at home as well. She is going back to the doctor tomorrow.    Diagnostic tests none   Patient Stated Goals reduce headaches, reduce thoracic/neck pain   Currently in Pain? No/denies                         [facility] Adult PT Treatment/Exercise - 06/02/17 0001      Neck Exercises: Seated   Other Seated Exercise Rt 1st rib MET x10 reps      Neck Exercises: Supine   Neck Retraction 15 reps   Neck Retraction Limitations 3 sec hold    Capital Flexion 10 reps   Capital Flexion Limitations with 1 in lift off table    Shoulder Flexion 10 reps;Both   Shoulder Flexion Limitations green TB around wrist and sustained horizontal abduction    Shoulder ABduction 15 reps   Shoulder Abduction Limitations green TB     Shoulder Exercises: Prone   Extension Both;10 reps   Other Prone Exercises Ys, Ts x10 reps each UE with shoulder neutral and shoulder ER      Manual Therapy   Manual Therapy Soft  tissue mobilization   Soft tissue mobilization Rt levator scap/upper trap                 PT Education - 06/02/17 1417    Education provided Yes   Education Details technique with therex; importance of addressing scapular and deep neck flexor strength/endurance to prevent overuse of the upper traps/paraspinals/suboccipitals; disucssed changes in pillow thickness to avoid straining her neck at night.    Person(s) Educated Patient   Methods Tactile cues;Explanation;Verbal cues   Comprehension Verbalized understanding;Returned demonstration          PT Short Term Goals - 05/25/17 1150      PT SHORT TERM GOAL #1   Title be independent in initial HEP   Time 4   Period Weeks   Status Achieved     PT SHORT TERM GOAL #2   Title report a 25% reduction in the frequency and intensity of headaches   Time 4   Period Weeks   Status Achieved     PT SHORT TERM GOAL #3   Title report < or = to 5/10 neck and  thoracic pain with daily activities   Period Weeks   Status Achieved  PT Long Term Goals - 05/27/17 1107      PT LONG TERM GOAL #1   Title be independent in advanced HEP   Time 8   Period Weeks   Status On-going     PT LONG TERM GOAL #2   Title reduce FOTO to < or = to 13% limitation   Time 8   Period Weeks   Status New     PT LONG TERM GOAL #3   Title report a 60% reduction in the frequency and intensity of headaches   Time 8   Period Weeks   Status On-going  25% better     PT LONG TERM GOAL #4   Title report no sleep interrruptions due to pain   Time 8   Period Weeks   Status On-going  still gets headaches at night     PT LONG TERM GOAL #5   Title report < or = to 3/10 thoracic and neck pain with daily activities   Time 8   Period Weeks   Status On-going  pain level 5/10               Plan - 06/02/17 1420    Clinical Impression Statement Pt arrived today reporting no pain or headaches for the past week. Session continued with  focus on therex to improve scapular strength as well as deep cervical flexor activation and endurance. Did note shaking and fatigue during cervical flexor strengthening. Pt required cues with cervical exercises secondary to weakness and poor neuromuscular control, however she did not report any increase in pain/stiffness following this activity. Ended with soft tissue techniques to address muscle spasm and prevent post workout soreness. Pt reporting no increase in symptoms by the end of today's session.   Rehab Potential Good   PT Frequency 2x / week   PT Duration 8 weeks   PT Treatment/Interventions ADLs/Self Care Home Management;Cryotherapy;Electrical Stimulation;Functional mobility training;Moist Heat;Traction;Ultrasound;Patient/family education;Therapeutic activities;Therapeutic exercise;Prosthetic Training;Manual techniques;Dry needling;Taping;Passive range of motion   PT Next Visit Plan dry needling to neck and thoracic spine if interested, prone scap strengthening, update HEP    PT Home Exercise Plan progress as needed   Consulted and Agree with Plan of Care Patient      Patient will benefit from skilled therapeutic intervention in order to improve the following deficits and impairments:  Postural dysfunction, Decreased strength, Pain, Decreased activity tolerance, Increased muscle spasms, Decreased range of motion  Visit Diagnosis: Cervicalgia  Pain in thoracic spine  Chronic tension-type headache, not intractable     Problem List Patient Active Problem List   Diagnosis Date Noted  . Tension headache, chronic 05/06/2017  . Chronic upper back pain 05/06/2017  . Insomnia 05/06/2017    Elly Modena PT, DPT 06/02/2017, 2:47 PM  [facility] [address], [place], [place], [address] Phone: [phone]   Fax:  [phone]  Name: Kathleen Ruiz MRN: [id number] Date of Birth: 1981-07-20

## 2017-06-02 NOTE — Progress Notes (Signed)
HPI:   [name] is a 36 y.o. female, who is here today to follow on recent OV.   She was seen on 05/06/17, when Amitriptyline 10 mg was started to treat headaches, tension like, and insomnia. She is also doing PT to help with chronic upper back pain.  Headache: She had milder headaches, "very light" for the fist 2 weeks. She had severe headache x 2 days, around her LMP, similar to her typical headache but shorter duration.She had 2 more days of headache but lighter. Since medication was started she has had a total of 4-5 headache. Denies new associated symptom.   Next and last PT tomorrow.  She also has noted improvement in upper back and cervical pain. Pain is not daily now and max 5-6/10 (8/10). She also had dry needle treatment.  -Sleeping "much" better, she feels rested when she gets up.  Sleeping about 7-8 hours.  LMP: 05/18/17  She has tolerated medications well, during the first few days she felt drowsy the next morning but this has resolved. She denies depressed mood.    Review of Systems  Constitutional: Negative for appetite change, fatigue, fever and unexpected weight change.  HENT: Negative for mouth sores, sore throat, trouble swallowing and voice change.   Eyes: Negative for redness and visual disturbance.  Respiratory: Negative for cough, shortness of breath and wheezing.   Cardiovascular: Negative for chest pain, palpitations and leg swelling.  Gastrointestinal: Negative for nausea and vomiting.  Musculoskeletal: Positive for back pain and neck pain. Negative for gait problem.  Neurological: Negative for syncope, weakness, numbness and headaches.  Hematological: Negative for adenopathy. Does not bruise/bleed easily.  Psychiatric/Behavioral: Negative for confusion, hallucinations, sleep disturbance and suicidal ideas. The patient is nervous/anxious.      No current outpatient prescriptions on file prior to visit.   No current  facility-administered medications on file prior to visit.      Past Medical History:  Diagnosis Date  . Allergy   . Headache    No Known Allergies  Social History   Social History  . Marital status: Married    Spouse name: N/A  . Number of children: N/A  . Years of education: N/A   Social History Main Topics  . Smoking status: Never Smoker  . Smokeless tobacco: Never Used  . Alcohol use No  . Drug use: No  . Sexual activity: Yes    Partners: Male    Birth control/ protection: Condom   Other Topics Concern  . None   Social History Narrative  . None    Vitals:   06/03/17 1048  BP: 110/78  Pulse: 86  Resp: 12  SpO2: 98%   Body mass index is 23.33 kg/m.   Physical Exam  Nursing note and vitals reviewed. Constitutional: She is oriented to person, place, and time. She appears well-developed and well-nourished. She does not appear ill. No distress.  HENT:  Head: Normocephalic and atraumatic.  Mouth/Throat: Oropharynx is clear and moist and mucous membranes are normal.  Eyes: Pupils are equal, round, and reactive to light. Conjunctivae and EOM are normal.  Respiratory: Effort normal and breath sounds normal. No respiratory distress.  GI: Soft. She exhibits no mass. There is no hepatomegaly. There is no tenderness.  Musculoskeletal: She exhibits no edema.       Cervical back: She exhibits tenderness and spasm. She exhibits normal range of motion.       Thoracic back: She exhibits no tenderness and  no bony tenderness.  Mild tenderness upon palpation of paraspinal muscles, right lower cervical and trapezium. + Spasm. Pain elicited with movement on exam table during examination. No local edema or erythema appreciated, no suspicious lesions.  Lymphadenopathy:    She has no cervical adenopathy.  Neurological: She is alert and oriented to person, place, and time. She has normal strength. Coordination and gait normal.  SLR negative bilateral.  Skin: Skin is warm. No  rash noted. No erythema.  Psychiatric: She has a normal mood and affect.  Well groomed, good eye contact.    ASSESSMENT AND PLAN:   Ms. Marcine MatarSomayeh was seen today for follow-up.  Diagnoses and all orders for this visit:  Chronic tension-type headache, not intractable  Improved. She is not interested in increasing dose of Amitriptyline and does not want to "depend" on this medication, so would like to take it for short time. Recommend taking med for 3-4 months then wean it off. Instructed about warning signs. F/U in 5-6 months, before if needed.  -     amitriptyline (ELAVIL) 10 MG tablet; Take 1 tablet (10 mg total) by mouth at bedtime.  Chronic upper back pain  Improved. Continue PT exercises at home. Local ice and massage. Chiropractor treatment may also help. Muscle relaxant to take as needed. Side effects of discussed.  -     methocarbamol (ROBAXIN) 500 MG tablet; Take 1 tablet (500 mg total) by mouth 2 (two) times daily as needed for muscle spasms.  Insomnia, unspecified type  Well controlled with Amitriptyline. No changes in current management. Good sleep hygiene.  -     amitriptyline (ELAVIL) 10 MG tablet; Take 1 tablet (10 mg total) by mouth at bedtime.      Betty G. SwazilandJordan, MD  [facility]. [facility].

## 2017-06-03 ENCOUNTER — Ambulatory Visit (INDEPENDENT_AMBULATORY_CARE_PROVIDER_SITE_OTHER): Payer: BLUE CROSS/BLUE SHIELD | Admitting: Family Medicine

## 2017-06-03 ENCOUNTER — Encounter: Payer: Self-pay | Admitting: Family Medicine

## 2017-06-03 VITALS — BP 110/78 | HR 86 | Resp 12 | Ht 67.25 in | Wt 150.1 lb

## 2017-06-03 DIAGNOSIS — G8929 Other chronic pain: Secondary | ICD-10-CM

## 2017-06-03 DIAGNOSIS — G44229 Chronic tension-type headache, not intractable: Secondary | ICD-10-CM

## 2017-06-03 DIAGNOSIS — M549 Dorsalgia, unspecified: Secondary | ICD-10-CM | POA: Diagnosis not present

## 2017-06-03 DIAGNOSIS — G4700 Insomnia, unspecified: Secondary | ICD-10-CM | POA: Diagnosis not present

## 2017-06-03 MED ORDER — METHOCARBAMOL 500 MG PO TABS: 500.0000 mg | ORAL_TABLET | Freq: Two times a day (BID) | ORAL | 1 refills | Status: DC | PRN

## 2017-06-03 MED ORDER — AMITRIPTYLINE HCL 10 MG PO TABS: 10.0000 mg | ORAL_TABLET | Freq: Every day | ORAL | 0 refills | Status: DC

## 2017-06-03 NOTE — Patient Instructions (Addendum)
A few things to remember from today's visit:   Chronic tension-type headache, not intractable - Plan: amitriptyline (ELAVIL) 10 MG tablet  Chronic upper back pain  Insomnia, unspecified type - Plan: amitriptyline (ELAVIL) 10 MG tablet  Plan: continue med for 3-4 months, then wean it off andmonitor for changes.  Please be sure medication list is accurate. If a new problem present, please set up appointment sooner than planned today.

## 2017-06-04 ENCOUNTER — Encounter: Payer: Self-pay | Admitting: Family Medicine

## 2017-06-04 ENCOUNTER — Ambulatory Visit: Payer: BLUE CROSS/BLUE SHIELD | Admitting: Physical Therapy

## 2017-06-04 DIAGNOSIS — G44229 Chronic tension-type headache, not intractable: Secondary | ICD-10-CM

## 2017-06-04 DIAGNOSIS — R252 Cramp and spasm: Secondary | ICD-10-CM

## 2017-06-04 DIAGNOSIS — M542 Cervicalgia: Secondary | ICD-10-CM

## 2017-06-04 DIAGNOSIS — M546 Pain in thoracic spine: Secondary | ICD-10-CM

## 2017-06-04 NOTE — Therapy (Addendum)
[facility] [address] [place], [place], [address] Phone: [phone]   Fax:  [phone]  Physical Therapy Treatment/discharge  Patient Details  Name: Embree Brawley MRN: [id number] Date of Birth: 10-20-1980 Referring Provider: Martinique, Betty, MD  Encounter Date: 06/04/2017      PT End of Session - 06/04/17 1206    Visit Number 7   Date for PT Re-Evaluation 07/07/17   Authorization Type BCBS   PT Start Time 8299  Pt arrived late   PT Stop Time 1227   PT Time Calculation (min) 31 min   Activity Tolerance Patient tolerated treatment well;No increased pain   Behavior During Therapy WFL for tasks assessed/performed      Past Medical History:  Diagnosis Date  . Allergy   . Headache     No past surgical history on file.  There were no vitals filed for this visit.      Subjective Assessment - 06/04/17 1159    Subjective Pt reports that things are going well. She saw her MD yesterday and was given a muscle relaxer to help with the tightness. No complaints at this time.    Diagnostic tests none   Patient Stated Goals reduce headaches, reduce thoracic/neck pain   Currently in Pain? No/denies                         [facility] Adult PT Treatment/Exercise - 06/04/17 0001      Neck Exercises: Supine   Capital Flexion 10 reps   Capital Flexion Limitations with 1 in lift off table x5 sec hold      Shoulder Exercises: Seated   Other Seated Exercises thoracic rotation Lt/Rt x10 reps each, thoracic lateral flexion stretch x10 reps Lt and Rt      Shoulder Exercises: Prone   Extension Both;10 reps   Other Prone Exercises Ys, Ts x10 reps each UE with shoulder neutral and shoulder ER      Shoulder Exercises: Standing   ABduction Both;10 reps   Theraband Level (Shoulder ABduction) Level 3 (Green)   ABduction Limitations with red TB pull anteriorly    Row Both;15 reps   Theraband Level (Shoulder  Row) Level 3 (Green)   Row Limitations high rows                 PT Education - 06/04/17 1236    Education provided Yes   Education Details discussed progress and recommendations to decrease frequency for the remainder of the POC to allow for increasing independence with pt's HEP; reviewed and updated HEP    Person(s) Educated Patient   Methods Explanation;Verbal cues;Handout;Tactile cues   Comprehension Returned demonstration;Verbalized understanding          PT Short Term Goals - 05/25/17 1150      PT SHORT TERM GOAL #1   Title be independent in initial HEP   Time 4   Period Weeks   Status Achieved     PT SHORT TERM GOAL #2   Title report a 25% reduction in the frequency and intensity of headaches   Time 4   Period Weeks   Status Achieved     PT SHORT TERM GOAL #3   Title report < or = to 5/10 neck and  thoracic pain with daily activities   Period Weeks   Status Achieved           PT Long Term Goals - 05/27/17 3716  PT LONG TERM GOAL #1   Title be independent in advanced HEP   Time 8   Period Weeks   Status On-going     PT LONG TERM GOAL #2   Title reduce FOTO to < or = to 13% limitation   Time 8   Period Weeks   Status New     PT LONG TERM GOAL #3   Title report a 60% reduction in the frequency and intensity of headaches   Time 8   Period Weeks   Status On-going  25% better     PT LONG TERM GOAL #4   Title report no sleep interrruptions due to pain   Time 8   Period Weeks   Status On-going  still gets headaches at night     PT LONG TERM GOAL #5   Title report < or = to 3/10 thoracic and neck pain with daily activities   Time 8   Period Weeks   Status On-going  pain level 5/10               Plan - 06/04/17 1228    Clinical Impression Statement Pt continues to make progress towards her goals, reporting steady improvements in her strength, flexibility and headaches. Session focused on reviewing and updating pt's HEP to  reflect her improvements and she was able to return demonstration of the proper technique with each exercise added. Ended with discussion regarding pt's appointments and recommended decrease to 1x/week for the remainder of her POC to allow for further progression of her HEP and preparation for d/c home with an advanced HEP. Pt verbalized understanding and agreement with this.    Rehab Potential Good   PT Frequency 2x / week   PT Duration 8 weeks   PT Treatment/Interventions ADLs/Self Care Home Management;Cryotherapy;Electrical Stimulation;Functional mobility training;Moist Heat;Traction;Ultrasound;Patient/family education;Therapeutic activities;Therapeutic exercise;Prosthetic Training;Manual techniques;Dry needling;Taping;Passive range of motion   PT Next Visit Plan dry needling to neck and thoracic spine as needed, continue to address deep cervical flexor endurance/strength as well as scapular control    PT Home Exercise Plan prone Ys/Ts/and shoulder extension, supine deep cervical flexion hold    Consulted and Agree with Plan of Care Patient      Patient will benefit from skilled therapeutic intervention in order to improve the following deficits and impairments:  Postural dysfunction, Decreased strength, Pain, Decreased activity tolerance, Increased muscle spasms, Decreased range of motion  Visit Diagnosis: Cervicalgia  Pain in thoracic spine  Chronic tension-type headache, not intractable  Cramp and spasm     Problem List Patient Active Problem List   Diagnosis Date Noted  . Tension headache, chronic 05/06/2017  . Chronic upper back pain 05/06/2017  . Insomnia 05/06/2017    12:37 PM,06/04/17 Marylyn Ishihara PT, DPT [facility]  [phone]  [facility] [address] [place], [place], [address] Phone: [phone]   Fax:  [phone]  Name: Zuma Hust MRN:  [id number] Date of Birth: 1981-05-12    PHYSICAL THERAPY DISCHARGE SUMMARY  Visits from Start of Care: 7  Current functional level related to goals / functional outcomes: See above for more details    Remaining deficits: See above for more details    Education / Equipment: See above for more details  Plan: Patient agrees to discharge.  Patient goals were partially met. Patient is being discharged due to not returning since the last visit.  ?????     Pt was  making good progress, so therapist discussed decreasing PT frequency at pt's last visit. She has not returned since this last visit and is being discharged from PT.   9:52 AM,08/13/17 Elly Modena PT, [place] [facility] [place]

## 2017-11-02 NOTE — Progress Notes (Signed)
HPI:   Kathleen Ruiz is a 37 y.o. female, who is here today for her routine physical.  Last CPE: 03/2016. She follows with gynecologist, Dr. Darcel Bayley.   Regular exercise 3 or more time per week: Yes, yoga ands swimming. Following a healthy diet: yes. She does not eat red meat. She lives with her husband and her daughter.  Chronic medical problems: Chronic tension headache, upper back pain, and insomnia among some.    There is no immunization history on file for this patient.   Father died from colon cancer Dx at 53-50 yo.  She has some concerns today. Chronic headache and cervical pain:  PT for cervical pain helped some. Upper back and neck pain, exacerbated by certain movements. She thinks Methocarbamol 500 mg 3 times daily as needed.  Headaches still present, Amitriptyline did not help.  She is taking medication as needed. She has had headaches for years, he was mainly around her menstrual.  But for the past 9-10 months headaches are becoming more frequent, almost daily. Occipital, bitemporal, and parietal pressure-like headaches. She denies associated nausea, vomiting, visual changes, or focal deficit. Occasionally she has associated photophobia. She has not identified exacerbating or alleviating factors.  Today she is complaining intermittent facial acne, which she has had for years.  She has not identified exacerbating or alleviating factors. In the past she was on topical and oral treatment, she does not remember the name of medications. Lesions are usually on forehead and cheeks.   Review of Systems  Constitutional: Positive for fatigue. Negative for appetite change and fever.  HENT: Negative for hearing loss, mouth sores, trouble swallowing and voice change.   Eyes: Negative for redness and visual disturbance.  Respiratory: Negative for cough, shortness of breath and wheezing.   Cardiovascular: Negative for chest pain and leg swelling.    Gastrointestinal: Negative for abdominal pain, nausea and vomiting.       No changes in bowel habits.  Endocrine: Negative for cold intolerance, heat intolerance, polydipsia, polyphagia and polyuria.  Genitourinary: Negative for decreased urine volume, dysuria, hematuria, vaginal bleeding and vaginal discharge.  Musculoskeletal: Positive for neck pain. Negative for arthralgias and gait problem.  Skin: Positive for rash. Negative for color change and wound.  Neurological: Positive for headaches. Negative for seizures, syncope, weakness and numbness.  Hematological: Negative for adenopathy. Does not bruise/bleed easily.  Psychiatric/Behavioral: Positive for sleep disturbance. Negative for confusion. The patient is nervous/anxious.   All other systems reviewed and are negative.     Current Outpatient Medications on File Prior to Visit  Medication Sig Dispense Refill  . amitriptyline (ELAVIL) 10 MG tablet Take 1 tablet (10 mg total) by mouth at bedtime. (Patient not taking: Reported on 11/03/2017) 90 tablet 0  . methocarbamol (ROBAXIN) 500 MG tablet Take 1 tablet (500 mg total) by mouth 2 (two) times daily as needed for muscle spasms. (Patient not taking: Reported on 11/03/2017) 30 tablet 1   No current facility-administered medications on file prior to visit.      Past Medical History:  Diagnosis Date  . Allergy   . Headache     History reviewed. No pertinent surgical history.  No Known Allergies  Family History  Problem Relation Age of Onset  . Colon cancer Father 41       died from colon cancer  . Cancer Father 20       colon  . Diabetes Neg Hx   . Hyperlipidemia Neg Hx   .  Hypertension Neg Hx     Social History   Socioeconomic History  . Marital status: Married    Spouse name: None  . Number of children: None  . Years of education: None  . Highest education level: None  Social Needs  . Financial resource strain: None  . Food insecurity - worry: None  . Food  insecurity - inability: None  . Transportation needs - medical: None  . Transportation needs - non-medical: None  Occupational History  . None  Tobacco Use  . Smoking status: Never Smoker  . Smokeless tobacco: Never Used  Substance and Sexual Activity  . Alcohol use: No  . Drug use: No  . Sexual activity: Yes    Partners: Male    Birth control/protection: Condom  Other Topics Concern  . None  Social History Narrative  . None     Vitals:   11/03/17 1009  BP: 118/70  Pulse: 69  Resp: 12  Temp: 98.5 F (36.9 C)  SpO2: 99%   Body mass index is 22.89 kg/m.   Wt Readings from Last 3 Encounters:  11/03/17 147 lb 4 oz (66.8 kg)  06/03/17 150 lb 1 oz (68.1 kg)  05/06/17 145 lb 8 oz (66 kg)     Physical Exam  Nursing note and vitals reviewed. Constitutional: She is oriented to person, place, and time. She appears well-developed and well-nourished. No distress.  HENT:  Head: Normocephalic and atraumatic.  Right Ear: Hearing, tympanic membrane, external ear and ear canal normal.  Left Ear: Hearing, tympanic membrane, external ear and ear canal normal.  Mouth/Throat: Uvula is midline, oropharynx is clear and moist and mucous membranes are normal.  Eyes: Conjunctivae and EOM are normal. Pupils are equal, round, and reactive to light.  Neck: No tracheal deviation present. No thyromegaly present.  Cardiovascular: Normal rate and regular rhythm.  No murmur heard. Pulses:      Dorsalis pedis pulses are 2+ on the right side, and 2+ on the left side.  Respiratory: Effort normal and breath sounds normal. No respiratory distress.  GI: Soft. She exhibits no mass. There is no hepatomegaly. There is no tenderness.  Genitourinary:  Genitourinary Comments: Deferred to gynecologist.  Musculoskeletal: She exhibits no edema or tenderness.       Cervical back: She exhibits no tenderness and no bony tenderness.  No major deformity or signs of synovitis appreciated.  Lymphadenopathy:     She has no cervical adenopathy.       Right: No supraclavicular adenopathy present.       Left: No supraclavicular adenopathy present.  Neurological: She is alert and oriented to person, place, and time. She has normal strength. No cranial nerve deficit. Coordination and gait normal.  Reflex Scores:      Bicep reflexes are 2+ on the right side and 2+ on the left side.      Patellar reflexes are 2+ on the right side and 2+ on the left side. Skin: Skin is warm. Rash noted. No erythema.  White comedones on forehead and chin mainly.  I do not appreciate pustular or cystic lesions.  Psychiatric: Her mood appears anxious. Cognition and memory are normal.  Well groomed, good eye contact.     ASSESSMENT AND PLAN:  Kathleen Ruiz was here today for her annual exam.   Orders Placed This Encounter  Procedures  . Flu Vaccine QUAD 36+ mos IM  . Tdap vaccine greater than or equal to 7yo IM  . Hemoglobin A1c  .  Lipid panel  . Ambulatory referral to Neurology   Lab Results  Component Value Date   CHOL 225 (H) 11/03/2017   HDL 100.00 11/03/2017   LDLCALC 107 (H) 11/03/2017   TRIG 91.0 11/03/2017   CHOLHDL 2 11/03/2017   Lab Results  Component Value Date   HGBA1C 5.6 11/03/2017    Routine general medical examination at a health care facility   We discussed the importance of regular physical activity and healthy diet for prevention of chronic illness and/or complications. Preventive guidelines reviewed. Vaccination updated. She will continue female preventive care with her gynecologist. Next CPE in a year.  Need for influenza vaccination -     Flu Vaccine QUAD 36+ mos IM  Headache, unspecified headache type  I still think that problem is mainly caused by tension headache.  Because no improvement with Amitriptyline, recommend to stop it. Since she is not taking med daily,I am not anticipating withdrawal like symptoms.  Neurology referral placed today. Instructed about  warning signs.  -     Ambulatory referral to Neurology  Acne vulgaris  After discussion of pharmacologic treatment options, she agrees with topical treatment. Some side effects discussed. Follow-up as needed.  -     Clindamycin-Benzoyl Per, Refr, (DUAC) gel; Apply 1 application topically 2 (two) times daily.  Diabetes mellitus screening -     Hemoglobin A1c  Screening for lipid disorders -     Lipid panel  Need for Tdap vaccination -     Tdap vaccine greater than or equal to 7yo IM     Return in 1 year (on 11/03/2018).    Betty G. Swazil[place]Jordan, MD  [facility]. [place] office.

## 2017-11-03 ENCOUNTER — Encounter: Payer: Self-pay | Admitting: Neurology

## 2017-11-03 ENCOUNTER — Ambulatory Visit (INDEPENDENT_AMBULATORY_CARE_PROVIDER_SITE_OTHER): Payer: BLUE CROSS/BLUE SHIELD | Admitting: Family Medicine

## 2017-11-03 ENCOUNTER — Encounter: Payer: Self-pay | Admitting: Family Medicine

## 2017-11-03 VITALS — BP 118/70 | HR 69 | Temp 98.5°F | Resp 12 | Ht 67.25 in | Wt 147.2 lb

## 2017-11-03 DIAGNOSIS — L700 Acne vulgaris: Secondary | ICD-10-CM

## 2017-11-03 DIAGNOSIS — Z0000 Encounter for general adult medical examination without abnormal findings: Secondary | ICD-10-CM

## 2017-11-03 DIAGNOSIS — R51 Headache: Secondary | ICD-10-CM | POA: Diagnosis not present

## 2017-11-03 DIAGNOSIS — Z131 Encounter for screening for diabetes mellitus: Secondary | ICD-10-CM

## 2017-11-03 DIAGNOSIS — Z13220 Encounter for screening for lipoid disorders: Secondary | ICD-10-CM | POA: Diagnosis not present

## 2017-11-03 DIAGNOSIS — Z23 Encounter for immunization: Secondary | ICD-10-CM | POA: Diagnosis not present

## 2017-11-03 DIAGNOSIS — R519 Headache, unspecified: Secondary | ICD-10-CM

## 2017-11-03 LAB — LIPID PANEL
CHOL/HDL RATIO: 2
Cholesterol: 225 mg/dL — ABNORMAL HIGH (ref 0–200)
HDL: 100 mg/dL (ref >39.00)
LDL Cholesterol: 107 mg/dL — ABNORMAL HIGH (ref 0–99)
NONHDL: 125.36
Triglycerides: 91 mg/dL (ref 0.0–149.0)
VLDL: 18.2 mg/dL (ref 0.0–40.0)

## 2017-11-03 LAB — HEMOGLOBIN A1C: Hgb A1c MFr Bld: 5.6 % (ref 4.6–6.5)

## 2017-11-03 MED ORDER — CLINDAMYCIN PHOS-BENZOYL PEROX 1.2-5 % EX GEL: 1.0000 "application " | Freq: Two times a day (BID) | CUTANEOUS | 1 refills | Status: DC

## 2017-11-03 NOTE — Patient Instructions (Addendum)
A few things to remember from today's visit:   Routine general medical examination at a health care facility  Need for influenza vaccination - Plan: Flu Vaccine QUAD 36+ mos IM  Headache, unspecified headache type - Plan: Ambulatory referral to Neurology  Acne vulgaris  Diabetes mellitus screening - Plan: Hemoglobin A1c  Screening for lipid disorders - Plan: Lipid panel  Today you have you routine preventive visit.  At least 150 minutes of moderate exercise per week, daily brisk walking for 15-30 min is a good exercise option. Healthy diet low in saturated (animal) fats and sweets and consisting of fresh fruits and vegetables, lean meats such as fish and white chicken and whole grains.  These are some of recommendations for screening depending of age and risk factors:   - Vaccines:  Tdap vaccine every 10 years. Given today.  Shingles vaccine recommended at age 560, could be given after 37 years of age but not sure about insurance coverage.   Pneumonia vaccines:  Prevnar 13 at 65 and Pneumovax at 66. Sometimes Pneumovax is giving earlier if history of smoking, lung disease,diabetes,kidney disease among some.    Screening for diabetes at age 37 and every 3 years.  Cervical cancer prevention:  Pap smear starts at 37 years of age and continues periodically until 37 years old in low risk women. Pap smear every 3 years between 6421 and 37 years old. Pap smear every 3-5 years between women 30 and older if pap smear negative and HPV screening negative.   -Breast cancer: Mammogram: There is disagreement between experts about when to start screening in low risk asymptomatic female but recent recommendations are to start screening at 2240 and not later than 37 years old , every 1-2 years and after 37 yo q 2 years. Screening is recommended until 37 years old but some women can continue screening depending of healthy issues.   Colon cancer screening: starts at 37 years old until 37 years  old.  Cholesterol disorder screening at age 37 and every 3 years.  Also recommended:  1. Dental visit- Brush and floss your teeth twice daily; visit your dentist twice a year. 2. Eye doctor- Get an eye exam at least every 2 years. 3. Helmet use- Always wear a helmet when riding a bicycle, motorcycle, rollerblading or skateboarding. 4. Safe sex- If you may be exposed to sexually transmitted infections, use a condom. 5. Seat belts- Seat belts can save your live; always wear one. 6. Smoke/Carbon Monoxide detectors- These detectors need to be installed on the appropriate level of your home. Replace batteries at least once a year. 7. Skin cancer- When out in the sun please cover up and use sunscreen 15 SPF or higher. 8. Violence- If anyone is threatening or hurting you, please tell your healthcare provider.  9. Drink alcohol in moderation- Limit alcohol intake to one drink or less per day. Never drink and drive. Please be sure medication list is accurate. If a new problem present, please set up appointment sooner than planned today.

## 2017-11-08 ENCOUNTER — Encounter: Payer: Self-pay | Admitting: Family Medicine

## 2017-12-30 ENCOUNTER — Ambulatory Visit: Payer: BLUE CROSS/BLUE SHIELD | Admitting: Neurology

## 2018-01-27 ENCOUNTER — Encounter: Payer: Self-pay | Admitting: Certified Nurse Midwife

## 2018-01-27 ENCOUNTER — Other Ambulatory Visit: Payer: Self-pay

## 2018-01-27 ENCOUNTER — Other Ambulatory Visit (HOSPITAL_COMMUNITY)
Admission: RE | Admit: 2018-01-27 | Discharge: 2018-01-27 | Disposition: A | Discharge status: Home / Self Care | Payer: BLUE CROSS/BLUE SHIELD | Source: Ambulatory Visit | Attending: Certified Nurse Midwife | Admitting: Certified Nurse Midwife

## 2018-01-27 ENCOUNTER — Ambulatory Visit: Payer: BLUE CROSS/BLUE SHIELD | Admitting: Certified Nurse Midwife

## 2018-01-27 VITALS — BP 120/70 | HR 68 | Resp 16 | Ht 67.75 in | Wt 147.0 lb

## 2018-01-27 DIAGNOSIS — Z124 Encounter for screening for malignant neoplasm of cervix: Secondary | ICD-10-CM | POA: Insufficient documentation

## 2018-01-27 DIAGNOSIS — Z1151 Encounter for screening for human papillomavirus (HPV): Secondary | ICD-10-CM | POA: Insufficient documentation

## 2018-01-27 DIAGNOSIS — Z01419 Encounter for gynecological examination (general) (routine) without abnormal findings: Secondary | ICD-10-CM

## 2018-01-27 NOTE — Addendum Note (Signed)
Addended by: Verner Chol on: 01/27/2018 04:19 PM   Modules accepted: Orders

## 2018-01-27 NOTE — Progress Notes (Signed)
37 y.o. G1P1001 Married  [place] here for annual exam.  Periods normal, no issues except for cycles are between 27 days and 35 days with 5-6 day duration. "Is this normal"? Some bloating around menses which is uncomfortable, suggestions? Has continued to have headache prior to period and continues even when period is over. Was seen by Dr.Jordan PCP for exam sent to PT for exercises with some help. Still continues, but no migraine with aura symptoms. Has been referred to Neurology for evaluation and plans to keep appointment. Happy with contraception choice of condoms. Eating well, no other concerns today.  Patient's last menstrual period was 01/15/2018 (exact date).          Sexually active: Yes.    The current method of family planning is condoms all the time.    Exercising: Yes.    zumba, yoga, swimming Smoker:  no  Health Maintenance: Pap:  2013 History of Abnormal Pap: no MMG:  none Self Breast exams: occ Colonoscopy:  None (father passed of colon cancer age 65) BMD:   none TDaP:  2019 Shingles: no Pneumonia: no Hep C and HIV: during pregnancy-neg Labs: no PCP    reports that she has never smoked. She has never used smokeless tobacco. She reports that she does not drink alcohol or use drugs.  Past Medical History:  Diagnosis Date  . Allergy   . Headache     History reviewed. No pertinent surgical history.  No current outpatient medications on file.   No current facility-administered medications for this visit.     Family History  Problem Relation Age of Onset  . Colon cancer Father 28       died from colon cancer  . Cancer Father 81       colon  . Diabetes Neg Hx   . Hyperlipidemia Neg Hx   . Hypertension Neg Hx     ROS:  Pertinent items are noted in HPI.  Otherwise, a comprehensive ROS was negative.  Exam:   BP 120/70   Pulse 68   Resp 16   Ht 5' 7.75" (1.721 m)   Wt 147 lb (66.7 kg)   LMP 01/15/2018 (Exact Date)   BMI 22.52 kg/m  Height: 5'  7.75" (172.1 cm) Ht Readings from Last 3 Encounters:  01/27/18 5' 7.75" (1.721 m)  11/03/17 5' 7.25" (1.708 m)  06/03/17 5' 7.25" (1.708 m)    General appearance: alert, cooperative and appears stated age Head: Normocephalic, without obvious abnormality, atraumatic Neck: no adenopathy, supple, symmetrical, trachea midline and thyroid normal to inspection and palpation Lungs: clear to auscultation bilaterally Breasts: normal appearance, no masses or tenderness, No nipple retraction or dimpling, No nipple discharge or bleeding, No axillary or supraclavicular adenopathy Heart: regular rate and rhythm Abdomen: soft, non-tender; no masses,  no organomegaly Extremities: extremities normal, atraumatic, no cyanosis or edema Skin: Skin color, texture, turgor normal. No rashes or lesions Lymph nodes: Cervical, supraclavicular, and axillary nodes normal. No abnormal inguinal nodes palpated Neurologic: Grossly normal   Pelvic: External genitalia:  no lesions, normal female              Urethra:  normal appearing urethra with no masses, tenderness or lesions              Bartholin's and Skene's: normal                 Vagina: normal appearing vagina with normal color and discharge, no lesions  Cervix: multiparous appearance, no cervical motion tenderness and no lesions              Pap taken: Yes.   Bimanual Exam:  Uterus:  normal size, contour, position, consistency, mobility, non-tender and anteverted              Adnexa: normal adnexa and no mass, fullness, tenderness               Rectovaginal: Confirms               Anus:  normal sphincter tone, no lesions  Chaperone present: yes  A:  Well Woman with normal exam  Contraception condoms  Bloating around menses  Headaches, non migraine? Has neurology evaluation scheduled  P:   Reviewed health and wellness pertinent to exam. Discussed her average cycle is 30 days with 5-6 day duration per review of her app on her phone, and  this is within normal range, no concerns. Discussed if occurring less than every 21 days or more than 2 months this is not normal and duration of 8 days or longer she should advise.  Discussed consistent use for best protection for contraception.   Discussed starting on B complex mid cycle until menses starts to see if this helps resolve. Make sure to drink adequate water daily and during period.  Keep appointment as scheduled. Warning signs of headaches given  Pap smear: yes   counseled on breast self exam, feminine hygiene, adequate intake of calcium and vitamin D, diet and exercise  return annually or prn  An After Visit Summary was printed and given to the patient.

## 2018-01-27 NOTE — Patient Instructions (Signed)

## 2018-01-29 LAB — CYTOLOGY - PAP
Diagnosis: NEGATIVE
HPV (WINDOPATH): NOT DETECTED

## 2018-02-17 ENCOUNTER — Ambulatory Visit: Payer: BLUE CROSS/BLUE SHIELD | Admitting: Neurology

## 2018-02-17 ENCOUNTER — Encounter: Payer: Self-pay | Admitting: Neurology

## 2018-02-17 VITALS — BP 120/90 | HR 68 | Ht 67.75 in | Wt 145.5 lb

## 2018-02-17 DIAGNOSIS — G43009 Migraine without aura, not intractable, without status migrainosus: Secondary | ICD-10-CM

## 2018-02-17 MED ORDER — NORTRIPTYLINE HCL 25 MG PO CAPS: 25.0000 mg | ORAL_CAPSULE | Freq: Every day | ORAL | 3 refills | Status: DC

## 2018-02-17 MED ORDER — SUMATRIPTAN SUCCINATE 100 MG PO TABS: ORAL_TABLET | ORAL | 2 refills | Status: DC

## 2018-02-17 NOTE — Patient Instructions (Signed)
1.  Start nortriptyline  at bedtime.  Contact me in 4 weeks with update and we can adjust dose if needed. 2.  Take sumatriptan  at earliest onset of headache.  May repeat dose once in 2 hours if needed.  Do not exceed two tablets in 24 hours. 3.  Limit use of pain relievers to no more than 2 days out of the week.  These medications include acetaminophen, ibuprofen, triptans and narcotics.  This will help reduce risk of rebound headaches. 4.  Be aware of common food triggers such as processed sweets, processed foods with nitrites (such as deli meat, hot dogs, sausages), foods with MSG, alcohol (such as wine), chocolate, certain cheeses, certain fruits (dried fruits, bananas, some citrus fruit), vinegar, diet soda. 4.  Avoid caffeine 5.  Routine exercise 6.  Proper sleep hygiene 7.  Stay adequately hydrated with water 8.  Keep a headache diary. 9.  Maintain proper stress management. 10.  Do not skip meals. 11.  Consider supplements:  Magnesium citrate  to  daily, riboflavin , Coenzyme Q 10  three times daily 12.  Follow up in 3 months.   Migraine Headache A migraine headache is a very strong throbbing pain on one side or both sides of your head. Migraines can also cause other symptoms. Talk with your doctor about what things may bring on (trigger) your migraine headaches. Follow these instructions at home: Medicines  Take over-the-counter and prescription medicines only as told by your doctor.  Do not drive or use heavy machinery while taking prescription pain medicine.  To prevent or treat constipation while you are taking prescription pain medicine, your doctor may recommend that you: ? Drink enough fluid to keep your pee (urine) clear or pale yellow. ? Take over-the-counter or prescription medicines. ? Eat foods that are high in fiber. These include fresh fruits and vegetables, whole grains, and beans. ? Limit foods that are high in fat and processed  sugars. These include fried and sweet foods. Lifestyle  Avoid alcohol.  Do not use any products that contain nicotine or tobacco, such as cigarettes and e-cigarettes. If you need help quitting, ask your doctor.  Get at least 8 hours of sleep every night.  Limit your stress. General instructions   Keep a journal to find out what may bring on your migraines. For example, write down: ? What you eat and drink. ? How much sleep you get. ? Any change in what you eat or drink. ? Any change in your medicines.  If you have a migraine: ? Avoid things that make your symptoms worse, such as bright lights. ? It may help to lie down in a dark, quiet room. ? Do not drive or use heavy machinery. ? Ask your doctor what activities are safe for you.  Keep all follow-up visits as told by your doctor. This is important. Contact a doctor if:  You get a migraine that is different or worse than your usual migraines. Get help right away if:  Your migraine gets very bad.  You have a fever.  You have a stiff neck.  You have trouble seeing.  Your muscles feel weak or like you cannot control them.  You start to lose your balance a lot.  You start to have trouble walking.  You pass out (faint). This information is not intended to replace advice given to you by your health care provider. Make sure you discuss any questions you have with your health care provider. Document Released:  06/24/2008 Document Revised: 04/04/2016 Document Reviewed: 03/03/2016 Elsevier Interactive Patient Education  [employer].

## 2018-02-17 NOTE — Progress Notes (Signed)
NEUROLOGY CONSULTATION NOTE  Kathleen Ruiz MRN: [id number] DOB: [birth year]  Referring provider: Dr. Swaziland Primary care provider: Dr. Swaziland  Reason for consult:  headache  HISTORY OF PRESENT ILLNESS: Kathleen Ruiz is a 37 year old right-handed female who presents for headaches.  History supplemented by referring provider's note.  Onset:  82-15 years old Location:  Bi-temporal, holocephalic Quality:  pounding Intensity:  severe.  She denies new headache, thunderclap headache or severe headache that wakes her from sleep. Aura:  no Prodrome:  no Postdrome:  no Associated symptoms:  She denies associated nausea, vomiting, photophobia, phonophobia, autonomic symptoms, visual disturbance or unilateral numbness or weakness. Duration:  Usually 24 hours (may last up to 4 days) Frequency:  4-5 days a month Frequency of abortive medication: 4 to 5 days a month Triggers/exacerbating factors:  Head movements, menstrual cycle Relieving factors:  no Activity:  aggravates  Current NSAIDS:  ibuprofen Current analgesics:  Acetaminophen, Excedrin Current triptans:  no Current anti-emetic:  no Current muscle relaxants:  no Current anti-anxiolytic:  no Current sleep aide:  no Current Antihypertensive medications:  no Current Antidepressant medications:  no Current Anticonvulsant medications:  no Current Vitamins/Herbal/Supplements:  no Current Antihistamines/Decongestants:  no Other therapy:  Yoga, exercise, swimming  Past NSAIDS:  no Past analgesics:  no Past abortive triptans:  no Past muscle relaxants:  methocarbamol  (ineffective) Past anti-emetic:  no Past antihypertensive medications:  no Past antidepressant medications:  amitriptyline  (self-discontinued because ineffective) Past anticonvulsant medications:  no Past vitamins/Herbal/Supplements:  no Past antihistamines/decongestants:  no Other past therapies:  Physical therapy upper back  Caffeine:   Stopped coffee Alcohol:  no Smoker:  no Diet:  Hydrates.  Eats fresh fruits and vegetables Exercise:  yes Depression:  no; Anxiety:  no Sleep hygiene:  poor.  She tends to her daughter who wakes up several times a night Family history of headache:  Grandmother (migraines), her mother may have had headaches  PAST MEDICAL HISTORY: Past Medical History:  Diagnosis Date  . Allergy   . Headache     PAST SURGICAL HISTORY: History reviewed. No pertinent surgical history.  MEDICATIONS: No current outpatient medications on file prior to visit.   No current facility-administered medications on file prior to visit.     ALLERGIES: No Known Allergies  FAMILY HISTORY: Family History  Problem Relation Age of Onset  . Colon cancer Father 73       died from colon cancer  . Cancer Father 66       colon  . Diabetes Neg Hx   . Hyperlipidemia Neg Hx   . Hypertension Neg Hx     SOCIAL HISTORY: Social History   Socioeconomic History  . Marital status: Married    Spouse name: Not on file  . Number of children: 1  . Years of education: 62  . Highest education level: Bachelor's degree (e.g., BA, AB, BS)  Occupational History  . Not on file  Social Needs  . Financial resource strain: Not on file  . Food insecurity:    Worry: Not on file    Inability: Not on file  . Transportation needs:    Medical: Not on file    Non-medical: Not on file  Tobacco Use  . Smoking status: Never Smoker  . Smokeless tobacco: Never Used  Substance and Sexual Activity  . Alcohol use: No  . Drug use: No  . Sexual activity: Yes    Partners: Male    Birth control/protection: Condom  Lifestyle  .  Physical activity:    Days per week: Not on file    Minutes per session: Not on file  . Stress: Not on file  Relationships  . Social connections:    Talks on phone: Not on file    Gets together: Not on file    Attends religious service: Not on file    Active member of club or organization: Not on file     Attends meetings of clubs or organizations: Not on file    Relationship status: Not on file  . Intimate partner violence:    Fear of current or ex partner: Not on file    Emotionally abused: Not on file    Physically abused: Not on file    Forced sexual activity: Not on file  Other Topics Concern  . Not on file  Social History Narrative   Lives with husband and daughter in an apartment on the 3rd floor.  Currently not working.  Education: BS    REVIEW OF SYSTEMS: Constitutional: No fevers, chills, or sweats, no generalized fatigue, change in appetite Eyes: No visual changes, double vision, eye pain Ear, nose and throat: No hearing loss, ear pain, nasal congestion, sore throat Cardiovascular: No chest pain, palpitations Respiratory:  No shortness of breath at rest or with exertion, wheezes GastrointestinaI: No nausea, vomiting, diarrhea, abdominal pain, fecal incontinence Genitourinary:  No dysuria, urinary retention or frequency Musculoskeletal:  No neck pain, back pain Integumentary: No rash, pruritus, skin lesions Neurological: as above Psychiatric: No depression, insomnia, anxiety Endocrine: No palpitations, fatigue, diaphoresis, mood swings, change in appetite, change in weight, increased thirst Hematologic/Lymphatic:  No purpura, petechiae. Allergic/Immunologic: no itchy/runny eyes, nasal congestion, recent allergic reactions, rashes  PHYSICAL EXAM: Vitals:   02/17/18 1004  BP: 120/90  Pulse: 68  SpO2: 97%   General: No acute distress.  Patient appears well-groomed.  Head:  Normocephalic/atraumatic Eyes:  fundi examined but not visualized Neck: supple, no paraspinal tenderness, full range of motion Back: No paraspinal tenderness Heart: regular rate and rhythm Lungs: Clear to auscultation bilaterally. Vascular: No carotid bruits. Neurological Exam: Mental status: alert and oriented to person, place, and time, recent and remote memory intact, fund of knowledge  intact, attention and concentration intact, speech fluent and not dysarthric, language intact. Cranial nerves: CN I: not tested CN II: pupils equal, round and reactive to light, visual fields intact CN III, IV, VI:  full range of motion, no nystagmus, no ptosis CN V: facial sensation intact CN VII: upper and lower face symmetric CN VIII: hearing intact CN IX, X: gag intact, uvula midline CN XI: sternocleidomastoid and trapezius muscles intact CN XII: tongue midline Bulk & Tone: normal, no fasciculations. Motor:  5/5 throughout  Sensation: temperature and vibration sensation intact. Deep Tendon Reflexes:  2+ throughout, toes downgoing.  Finger to nose testing:  Without dysmetria.  Heel to shin:  Without dysmetria.  Gait:  Normal station and stride.  Able to turn and tandem walk. Romberg negative.  IMPRESSION: Migraine without aura, not intractable  PLAN: 1.  She was only on a low dose of a TCA.  I will start her on nortriptyline at  at bedtime and we can increase dose in 4 weeks if needed. 2.  Sumatriptan  for abortive therapy 3.  Limit use of pain relievers to no more than 2 days out of week to prevent rebound headache 4.  Headache diary 5.  Continue exercise, hydration, try to optimize sleep hygiene 6.  Consider magnesium citrate  daily,  riboflavin  daily and CoQ10  three times daily. 7.  Follow up in 3 months.  Thank you for allowing me to take part in the care of this patient.  45 minutes spent face to face with patient, over 50% spent discussing management and diagnosis.  Shon Millet, DO  CC:  Betty Swaziland, MD

## 2018-03-14 ENCOUNTER — Encounter: Payer: Self-pay | Admitting: Neurology

## 2018-06-14 NOTE — Progress Notes (Signed)
NEUROLOGY FOLLOW UP OFFICE NOTE  Kathleen Ruiz [id number]  HISTORY OF PRESENT ILLNESS: Kathleen Ruiz is a 37 year old right-handed female who follows up for migraines.  UPDATE: Nortriptyline is causing side effects (poor sleep, vivid dreams, problems with memory, blurred vision).  Minimally effective.  When she took sumatriptan, she noted increased drowsiness and "collapse".  However, sometimes it is effective. Intensity:  severe Duration:  24 hours Frequency:  4 to 5 days a month (2-3 days during menstrual period) Frequency of abortive medication:  Current NSAIDS:  no Current analgesics:  no Current triptans:  Sumatriptan 100mg  Current ergotamine:  no Current anti-emetic:  no Current muscle relaxants:  no Current anti-anxiolytic:  no Current sleep aide:  no Current Antihypertensive medications:  no Current Antidepressant medications:  Nortriptyline 25mg  at bedtime Current Anticonvulsant medications:  no Current anti-CGRP:  no Current Vitamins/Herbal/Supplements:  Magnesium citrate 400mg , CoQ10 100mg  TID, riboflavin 400mg  Current Antihistamines/Decongestants:  no Other therapy:  no Other medication:  no  Caffeine:  no Alcohol:  no Smoker:  no Diet:  Hydrates.  Eats fresh fruits and vegetables Exercise:  yes Depression:  no; Anxiety:  yes Other pain:  no Sleep hygiene:  Poor.  She tends to her daughter who wakes her up several times a night.   HISTORY: Onset:  51-62 years old Location:  Bi-temporal, holocephalic Quality:  pounding Initial intensity:  severe.  She denies new headache, thunderclap headache or severe headache that wakes her from sleep. Aura:  no Prodrome:  no Postdrome:  no Associated symptoms:  She denies associated nausea, vomiting, photophobia, phonophobia, autonomic symptoms, visual disturbance or unilateral numbness or weakness. Initial Duration:  Usually 24 hours (may last up to 4 days) Initial Frequency:  4-5 days a month Initial  Frequency of abortive medication: 4 to 5 days a month Triggers/aggravating factors:  Head movements, menstrual cycle Relieving factors:  no Activity:  aggravates  Past NSAIDS:  ibuprofen, naproxen Past analgesics:  Tylenol Past abortive triptans:  no Past muscle relaxants:  methocarbamol 500mg  (ineffective) Past anti-emetic:  no Past antihypertensive medications:  no Past antidepressant medications:  amitriptyline 10mg  (self-discontinued because ineffective) Past anticonvulsant medications:  no Past vitamins/Herbal/Supplements:  no Past antihistamines/decongestants:  no Other past therapies:  Physical therapy upper back  Family history of headache:  Grandmother (migraines), her mother may have had headaches  PAST MEDICAL HISTORY: Past Medical History:  Diagnosis Date  . Allergy   . Headache     MEDICATIONS: Current Outpatient Medications on File Prior to Visit  Medication Sig Dispense Refill  . nortriptyline (PAMELOR) 25 MG capsule Take 1 capsule (25 mg total) by mouth at bedtime. 30 capsule 3  . SUMAtriptan (IMITREX) 100 MG tablet Take 1 tablet earliest onset of headache.  May repeat x1 in 2 hours if headache persists or recurs.  Do not exceed 2 tablets in 24 hours 10 tablet 2   No current facility-administered medications on file prior to visit.     ALLERGIES: No Known Allergies  FAMILY HISTORY: Family History  Problem Relation Age of Onset  . Colon cancer Father 20       died from colon cancer  . Cancer Father 48       colon  . Diabetes Neg Hx   . Hyperlipidemia Neg Hx   . Hypertension Neg Hx    SOCIAL HISTORY: Social History   Socioeconomic History  . Marital status: Married    Spouse name: Not on file  . Number of children: 1  .  Years of education: 84  . Highest education level: Bachelor's degree (e.g., BA, AB, BS)  Occupational History  . Not on file  Social Needs  . Financial resource strain: Not on file  . Food insecurity:    Worry: Not on file      Inability: Not on file  . Transportation needs:    Medical: Not on file    Non-medical: Not on file  Tobacco Use  . Smoking status: Never Smoker  . Smokeless tobacco: Never Used  Substance and Sexual Activity  . Alcohol use: No  . Drug use: No  . Sexual activity: Yes    Partners: Male    Birth control/protection: Condom  Lifestyle  . Physical activity:    Days per week: Not on file    Minutes per session: Not on file  . Stress: Not on file  Relationships  . Social connections:    Talks on phone: Not on file    Gets together: Not on file    Attends religious service: Not on file    Active member of club or organization: Not on file    Attends meetings of clubs or organizations: Not on file    Relationship status: Not on file  . Intimate partner violence:    Fear of current or ex partner: Not on file    Emotionally abused: Not on file    Physically abused: Not on file    Forced sexual activity: Not on file  Other Topics Concern  . Not on file  Social History Narrative   Lives with husband and daughter in an apartment on the 3rd floor.  Currently not working.  Education: BS    REVIEW OF SYSTEMS: Constitutional: No fevers, chills, or sweats, no generalized fatigue, change in appetite Eyes: No visual changes, double vision, eye pain Ear, nose and throat: No hearing loss, ear pain, nasal congestion, sore throat Cardiovascular: No chest pain, palpitations Respiratory:  No shortness of breath at rest or with exertion, wheezes GastrointestinaI: No nausea, vomiting, diarrhea, abdominal pain, fecal incontinence Genitourinary:  No dysuria, urinary retention or frequency Musculoskeletal:  No neck pain, back pain Integumentary: No rash, pruritus, skin lesions Neurological: as above Psychiatric: No depression, insomnia, anxiety Endocrine: No palpitations, fatigue, diaphoresis, mood swings, change in appetite, change in weight, increased thirst Hematologic/Lymphatic:  No  purpura, petechiae. Allergic/Immunologic: no itchy/runny eyes, nasal congestion, recent allergic reactions, rashes  PHYSICAL EXAM: Blood pressure 122/84, pulse (!) 104, height 5' 8.5" (1.74 m), weight 147 lb (66.7 kg), SpO2 100 %. General: No acute distress.  Patient appears well-groomed.   Head:  Normocephalic/atraumatic Eyes:  Fundi examined but not visualized Neck: supple, no paraspinal tenderness, full range of motion Heart:  Regular rate and rhythm Lungs:  Clear to auscultation bilaterally Back: No paraspinal tenderness Neurological Exam: alert and oriented to person, place, and time. Attention span and concentration intact, recent and remote memory intact, fund of knowledge intact.  Speech fluent and not dysarthric, language intact.  CN II-XII intact. Bulk and tone normal, muscle strength 5/5 throughout.  Sensation to light touch, temperature and vibration intact.  Deep tendon reflexes 2+ throughout, toes downgoing.  Finger to nose and heel to shin testing intact.  Gait normal, Romberg negative.  IMPRESSION: Migraine without aura, without status migrainosus, not intractable.  PLAN: 1.  Stop nortriptyline.  Will try topiramate 25mg  at bedtime and may increase dose to 50mg  at bedtime in 4 weeks if needed.  Side effects discussed.  Advised not to  get pregnant while on it. 2.  Stop sumatriptan.  For abortive therapy, start Maxalt MLT 10mg  3.  Limit use of pain relievers to no more than 2 days out of week to prevent risk of rebound or medication-overuse headache. 4.  Keep headache diary 5.  Follow up in 3 to 4 months.  Shon MilletAdam Jaffe, DO  CC: Betty SwazilandJordan, MD

## 2018-06-15 ENCOUNTER — Encounter: Payer: Self-pay | Admitting: Neurology

## 2018-06-15 ENCOUNTER — Ambulatory Visit: Payer: BLUE CROSS/BLUE SHIELD | Admitting: Neurology

## 2018-06-15 ENCOUNTER — Other Ambulatory Visit: Payer: BLUE CROSS/BLUE SHIELD

## 2018-06-15 VITALS — BP 122/84 | HR 104 | Ht 68.5 in | Wt 147.0 lb

## 2018-06-15 DIAGNOSIS — G43009 Migraine without aura, not intractable, without status migrainosus: Secondary | ICD-10-CM

## 2018-06-15 DIAGNOSIS — Z79899 Other long term (current) drug therapy: Secondary | ICD-10-CM

## 2018-06-15 LAB — BASIC METABOLIC PANEL
BUN: 9 mg/dL (ref 7–25)
CO2: 26 mmol/L (ref 20–32)
Calcium: 9.8 mg/dL (ref 8.6–10.2)
Chloride: 103 mmol/L (ref 98–110)
Creat: 0.82 mg/dL (ref 0.50–1.10)
Glucose, Bld: 91 mg/dL (ref 65–99)
POTASSIUM: 4.8 mmol/L (ref 3.5–5.3)
SODIUM: 138 mmol/L (ref 135–146)

## 2018-06-15 MED ORDER — RIZATRIPTAN BENZOATE 10 MG PO TBDP: ORAL_TABLET | ORAL | 3 refills | Status: DC

## 2018-06-15 MED ORDER — TOPIRAMATE 25 MG PO TABS: 25.0000 mg | ORAL_TABLET | Freq: Every day | ORAL | 3 refills | Status: DC

## 2018-06-15 NOTE — Patient Instructions (Addendum)
Migraine Recommendations: 1.  Stop nortriptyline.  Start topiramate 25mg  at bedtime.  Contact me in 4 weeks with update and we can adjust dose if needed. 2.  Sumatriptan.  Instead, take rizatriptan (Maxalt MLT) 10mg  at earliest onset of headache.  May repeat dose once in 2 hours if needed.  Do not exceed two doses in 24 hours. 3.  Limit use of pain relievers to no more than 2 days out of the week.  These medications include acetaminophen, ibuprofen, triptans and narcotics.  This will help reduce risk of rebound headaches. 4.  Be aware of common food triggers such as processed sweets, processed foods with nitrites (such as deli meat, hot dogs, sausages), foods with MSG, alcohol (such as wine), chocolate, certain cheeses, certain fruits (dried fruits, bananas, some citrus fruit), vinegar, diet soda. 4.  Avoid caffeine 5.  Routine exercise 6.  Proper sleep hygiene 7.  Stay adequately hydrated with water 8.  Keep a headache diary. 9.  Maintain proper stress management. 10.  Do not skip meals. 11.  Consider supplements:  Magnesium citrate 400mg  to 600mg  daily, riboflavin 400mg , Coenzyme Q 10 100mg  three times daily 12.  Follow up in 3 to 4 months.

## 2018-10-19 ENCOUNTER — Ambulatory Visit: Payer: BLUE CROSS/BLUE SHIELD | Admitting: Neurology

## 2018-10-25 ENCOUNTER — Encounter: Payer: Self-pay | Admitting: Family Medicine

## 2018-10-25 ENCOUNTER — Ambulatory Visit (INDEPENDENT_AMBULATORY_CARE_PROVIDER_SITE_OTHER): Payer: BLUE CROSS/BLUE SHIELD | Admitting: Family Medicine

## 2018-10-25 VITALS — BP 120/72 | HR 61 | Temp 98.7°F | Resp 12 | Ht 68.5 in | Wt 147.0 lb

## 2018-10-25 DIAGNOSIS — Z13228 Encounter for screening for other metabolic disorders: Secondary | ICD-10-CM | POA: Diagnosis not present

## 2018-10-25 DIAGNOSIS — Z0000 Encounter for general adult medical examination without abnormal findings: Secondary | ICD-10-CM

## 2018-10-25 DIAGNOSIS — Z23 Encounter for immunization: Secondary | ICD-10-CM | POA: Diagnosis not present

## 2018-10-25 DIAGNOSIS — E785 Hyperlipidemia, unspecified: Secondary | ICD-10-CM | POA: Diagnosis not present

## 2018-10-25 DIAGNOSIS — Z1329 Encounter for screening for other suspected endocrine disorder: Secondary | ICD-10-CM | POA: Diagnosis not present

## 2018-10-25 DIAGNOSIS — Z130 Encounter for screening for diseases of the blood and blood-forming organs and certain disorders involving the immune mechanism: Secondary | ICD-10-CM | POA: Diagnosis not present

## 2018-10-25 LAB — BASIC METABOLIC PANEL
BUN: 12 mg/dL (ref 6–23)
CALCIUM: 10 mg/dL (ref 8.4–10.5)
CO2: 23 mEq/L (ref 19–32)
Chloride: 105 mEq/L (ref 96–112)
Creatinine, Ser: 0.87 mg/dL (ref 0.40–1.20)
GFR: 72.99 mL/min (ref >60.00)
GLUCOSE: 89 mg/dL (ref 70–99)
POTASSIUM: 4.3 meq/L (ref 3.5–5.1)
SODIUM: 140 meq/L (ref 135–145)

## 2018-10-25 LAB — LIPID PANEL
Cholesterol: 218 mg/dL — ABNORMAL HIGH (ref 0–200)
HDL: 81 mg/dL (ref >39.00)
LDL Cholesterol: 111 mg/dL — ABNORMAL HIGH (ref 0–99)
NONHDL: 136.57
TRIGLYCERIDES: 129 mg/dL (ref 0.0–149.0)
Total CHOL/HDL Ratio: 3
VLDL: 25.8 mg/dL (ref 0.0–40.0)

## 2018-10-25 NOTE — Patient Instructions (Addendum)
A few things to remember from today's visit:   Routine general medical examination at a health care facility  Hyperlipidemia, unspecified hyperlipidemia type - Plan: Lipid panel  Screening for endocrine, metabolic and immunity disorder - Plan: Basic metabolic panel   Today you have you routine preventive visit.  At least 150 minutes of moderate exercise per week, daily brisk walking for 15-30 min is a good exercise option. Healthy diet low in saturated (animal) fats and sweets and consisting of fresh fruits and vegetables, lean meats such as fish and white chicken and whole grains.  These are some of recommendations for screening depending of age and risk factors:   - Vaccines:  Tdap vaccine every 10 years.  Shingles vaccine recommended at age 26, could be given after 38 years of age but not sure about insurance coverage.   Pneumonia vaccines:  Prevnar 13 at 65 and Pneumovax at 66. Sometimes Pneumovax is giving earlier if history of smoking, lung disease,diabetes,kidney disease among some.    Screening for diabetes at age 38 and every 3 years.  Cervical cancer prevention:  Pap smear starts at 38 years of age and continues periodically until 38 years old in low risk women. Pap smear every 3 years between 48 and 74 years old. Pap smear every 3-5 years between women 30 and older if pap smear negative and HPV screening negative.   -Breast cancer: Mammogram: There is disagreement between experts about when to start screening in low risk asymptomatic female but recent recommendations are to start screening at 80 and not later than 38 years old , every 1-2 years and after 38 yo q 2 years. Screening is recommended until 38 years old but some women can continue screening depending of healthy issues.   Colon cancer screening: starts at 38 years old until 38 years old.  Cholesterol annually,before if very abnormal.  Also recommended:  1. Dental visit- Brush and floss your teeth  twice daily; visit your dentist twice a year. 2. Eye doctor- Get an eye exam at least every 2 years. 3. Helmet use- Always wear a helmet when riding a bicycle, motorcycle, rollerblading or skateboarding. 4. Safe sex- If you may be exposed to sexually transmitted infections, use a condom. 5. Seat belts- Seat belts can save your live; always wear one. 6. Smoke/Carbon Monoxide detectors- These detectors need to be installed on the appropriate level of your home. Replace batteries at least once a year. 7. Skin cancer- When out in the sun please cover up and use sunscreen 15 SPF or higher. 8. Violence- If anyone is threatening or hurting you, please tell your healthcare provider.  9. Drink alcohol in moderation- Limit alcohol intake to one drink or less per day. Never drink and drive.

## 2018-10-25 NOTE — Progress Notes (Signed)
HPI:   Kathleen Ruiz is a 38 y.o. female, who is here today for her routine physical.  Last CPE: 11/03/2017  Regular exercise 3 or more time per week: Not consistently Following a healthful diet: Most of the time. She lives with her husband and daughter.  Chronic medical problems: Headache,upper back pain,HLD,and anxiety among some. Following with Dr Earnestine MealingFreeman,neuro.   She sees her gyn annually, last pap smear 01/2018. HLD, she is on non pharmacologic treatment. 11/03/17:  TC 225,TG 91,HDL 100,and LDL 107.   Immunization History  Administered Date(s) Administered  . Influenza,inj,Quad PF,6+ Mos 11/03/2017, 10/25/2018  . Tdap 11/03/2017   She would like to have breast exam today. Denies breast tenderness,skin changes,or nipple discharge.   Review of Systems  Constitutional: Negative for appetite change, fatigue and fever.  HENT: Negative for dental problem, hearing loss, mouth sores, sore throat, trouble swallowing and voice change.   Eyes: Negative for redness and visual disturbance.  Respiratory: Negative for cough, shortness of breath and wheezing.   Cardiovascular: Negative for chest pain and leg swelling.  Gastrointestinal: Negative for abdominal pain, nausea and vomiting.       No changes in bowel habits.  Endocrine: Negative for cold intolerance, heat intolerance, polydipsia, polyphagia and polyuria.  Genitourinary: Negative for decreased urine volume, dysuria, hematuria, vaginal bleeding and vaginal discharge.  Musculoskeletal: Negative for arthralgias, gait problem and myalgias.  Skin: Negative for color change and rash.  Allergic/Immunologic: Negative for environmental allergies.  Neurological: Negative for syncope, weakness and headaches.  Hematological: Negative for adenopathy. Does not bruise/bleed easily.  Psychiatric/Behavioral: Negative for confusion and sleep disturbance. The patient is nervous/anxious.   All other systems reviewed and are  negative.     Current Outpatient Medications on File Prior to Visit  Medication Sig Dispense Refill  . baclofen (LIORESAL) 10 MG tablet     . zonisamide (ZONEGRAN) 25 MG capsule      No current facility-administered medications on file prior to visit.      Past Medical History:  Diagnosis Date  . Allergy   . Headache     History reviewed. No pertinent surgical history.  No Known Allergies  Family History  Problem Relation Age of Onset  . Colon cancer Father [id number]       died from colon cancer  . Cancer Father [id number]       colon  . Diabetes Neg Hx   . Hyperlipidemia Neg Hx   . Hypertension Neg Hx     Social History   Socioeconomic History  . Marital status: Married    Spouse name: Not on file  . Number of children: 1  . Years of education: 6516  . Highest education level: Bachelor's degree (e.g., BA, AB, BS)  Occupational History  . Not on file  Social Needs  . Financial resource strain: Not on file  . Food insecurity:    Worry: Not on file    Inability: Not on file  . Transportation needs:    Medical: Not on file    Non-medical: Not on file  Tobacco Use  . Smoking status: Never Smoker  . Smokeless tobacco: Never Used  Substance and Sexual Activity  . Alcohol use: No  . Drug use: No  . Sexual activity: Yes    Partners: Male    Birth control/protection: Condom  Lifestyle  . Physical activity:    Days per week: Not on file    Minutes per session: Not on  file  . Stress: Not on file  Relationships  . Social connections:    Talks on phone: Not on file    Gets together: Not on file    Attends religious service: Not on file    Active member of club or organization: Not on file    Attends meetings of clubs or organizations: Not on file    Relationship status: Not on file  Other Topics Concern  . Not on file  Social History Narrative   Lives with husband and daughter in an apartment on the 3rd floor.  Currently not working.  Education: BS     Vitals:    10/25/18 0925  BP: 120/72  Pulse: 61  Resp: 12  Temp: 98.7 F (37.1 C)  SpO2: 99%   Body mass index is 22.03 kg/m.   Wt Readings from Last 3 Encounters:  10/25/18 147 lb (66.7 kg)  06/15/18 147 lb (66.7 kg)  02/17/18 145 lb 8 oz (66 kg)     Physical Exam  Nursing note and vitals reviewed. Constitutional: She is oriented to person, place, and time. She appears well-developed and well-nourished. No distress.  HENT:  Head: Normocephalic and atraumatic.  Right Ear: Hearing, tympanic membrane, external ear and ear canal normal.  Left Ear: Hearing, tympanic membrane, external ear and ear canal normal.  Mouth/Throat: Uvula is midline, oropharynx is clear and moist and mucous membranes are normal.  Eyes: Pupils are equal, round, and reactive to light. Conjunctivae and EOM are normal.  Neck: No tracheal deviation present. No thyromegaly present.  Cardiovascular: Normal rate and regular rhythm.  No murmur heard. Pulses:      Dorsalis pedis pulses are 2+ on the right side, and 2+ on the left side.  Respiratory: Effort normal and breath sounds normal. No respiratory distress.  GI: Soft. She exhibits no mass. There is no hepatomegaly. There is no tenderness.  Genitourinary:  Genitourinary Comments: Breast: No masses,nipple discharge,or tenderness. Mild nodularity noted with palpation.outer/upper quadrants mainly. Rest of gyn exam referred to gyn.  Musculoskeletal: She exhibits no edema.  No major deformity or signs of synovitis appreciated.  Lymphadenopathy:    She has no cervical adenopathy.       Right: No supraclavicular adenopathy present.       Left: No supraclavicular adenopathy present.  Neurological: She is alert and oriented to person, place, and time. She has normal strength. No cranial nerve deficit. Coordination and gait normal.  Reflex Scores:      Bicep reflexes are 2+ on the right side and 2+ on the left side.      Patellar reflexes are 2+ on the right side and 2+ on  the left side. Skin: Skin is warm. No rash noted. No erythema.  Psychiatric: She is anxious.  Well groomed, good eye contact.     ASSESSMENT AND PLAN:  Kathleen Ruiz was here today annual physical examination.   Orders Placed This Encounter  Procedures  . Flu Vaccine QUAD 36+ mos IM  . Basic metabolic panel  . Lipid panel    Lab Results  Component Value Date   CHOL 218 (H) 10/25/2018   HDL 81.00 10/25/2018   LDLCALC 111 (H) 10/25/2018   TRIG 129.0 10/25/2018   CHOLHDL 3 10/25/2018   Lab Results  Component Value Date   CREATININE 0.87 10/25/2018   BUN 12 10/25/2018   NA 140 10/25/2018   K 4.3 10/25/2018   CL 105 10/25/2018   CO2 23 10/25/2018  Routine general medical examination at a health care facility We discussed the importance of regular physical activity and healthy diet for prevention of chronic illness and/or complications. Preventive guidelines reviewed. Vaccination up to date,influenza vaccine given today. She will continue following with gynecologist for her female preventive care.  Next CPE in a year.  Hyperlipidemia, unspecified hyperlipidemia type Continue non pharmacologic treatment. Further recommendations will be given according to FLP results.  -     Lipid panel  Screening for endocrine, metabolic and immunity disorder -     Basic metabolic panel  Need for immunization against influenza -     Flu Vaccine QUAD 36+ mos IM     Return in about 1 year (around 10/26/2019) for CPE.      Anab Vivar G. Swazil[place], MD  [facility]. [place] office.

## 2018-10-28 ENCOUNTER — Encounter: Payer: Self-pay | Admitting: Family Medicine

## 2018-12-24 ENCOUNTER — Other Ambulatory Visit: Payer: Self-pay

## 2018-12-24 MED ORDER — RIZATRIPTAN BENZOATE 10 MG PO TBDP: 10.0000 mg | ORAL_TABLET | ORAL | 3 refills | Status: DC | PRN

## 2019-02-01 ENCOUNTER — Other Ambulatory Visit: Payer: Self-pay

## 2019-02-01 ENCOUNTER — Telehealth: Payer: Self-pay | Admitting: Certified Nurse Midwife

## 2019-02-01 ENCOUNTER — Ambulatory Visit: Payer: BLUE CROSS/BLUE SHIELD | Admitting: Obstetrics and Gynecology

## 2019-02-01 ENCOUNTER — Encounter: Payer: Self-pay | Admitting: Obstetrics and Gynecology

## 2019-02-01 VITALS — BP 120/70 | HR 64 | Temp 98.5°F | Resp 16 | Wt 147.0 lb

## 2019-02-01 DIAGNOSIS — N6310 Unspecified lump in the right breast, unspecified quadrant: Secondary | ICD-10-CM

## 2019-02-01 DIAGNOSIS — N644 Mastodynia: Secondary | ICD-10-CM | POA: Diagnosis not present

## 2019-02-01 DIAGNOSIS — N6315 Unspecified lump in the right breast, overlapping quadrants: Secondary | ICD-10-CM

## 2019-02-01 NOTE — Progress Notes (Signed)
GYNECOLOGY  VISIT   HPI: 38 y.o.   Married Unavailable Not Hispanic or Latino  female   [id number] with Patient's last menstrual period was 01/20/2019 (exact date).   here for right breast pain for 2-3 days She was doing physical exertion and noticed discomfort. When she did an exam on herself it felt different on the right than the left. She is having some axillary discomfort on the right as well.  LMP was normal, regular cycles.   GYNECOLOGIC HISTORY: Patient's last menstrual period was 01/20/2019 (exact date). Contraception:condoms Menopausal hormone therapy: none        OB History    Gravida  1   Para  1   Term  1   Preterm      AB      Living  1     SAB      TAB      Ectopic      Multiple      Live Births                 Patient Active Problem List   Diagnosis Date Noted  . Hyperlipidemia, unspecified 10/25/2018  . Tension headache, chronic 05/06/2017  . Chronic upper back pain 05/06/2017  . Insomnia 05/06/2017    Past Medical History:  Diagnosis Date  . Allergy   . Headache     History reviewed. No pertinent surgical history.  Current Outpatient Medications  Medication Sig Dispense Refill  . rizatriptan (MAXALT-MLT) 10 MG disintegrating tablet Take 1 tablet (10 mg total) by mouth as needed for migraine. May repeat in 2 hours if needed 9 tablet 3   No current facility-administered medications for this visit.      ALLERGIES: Patient has no known allergies.  Family History  Problem Relation Age of Onset  . Colon cancer Father 649       died from colon cancer  . Cancer Father [id number]       colon  . Diabetes Neg Hx   . Hyperlipidemia Neg Hx   . Hypertension Neg Hx     Social History   Socioeconomic History  . Marital status: Married    Spouse name: Not on file  . Number of children: 1  . Years of education: 5716  . Highest education level: Bachelor's degree (e.g., BA, AB, BS)  Occupational History  . Not on file  Social Needs  .  Financial resource strain: Not on file  . Food insecurity:    Worry: Not on file    Inability: Not on file  . Transportation needs:    Medical: Not on file    Non-medical: Not on file  Tobacco Use  . Smoking status: Never Smoker  . Smokeless tobacco: Never Used  Substance and Sexual Activity  . Alcohol use: No  . Drug use: No  . Sexual activity: Yes    Partners: Male    Birth control/protection: Condom  Lifestyle  . Physical activity:    Days per week: Not on file    Minutes per session: Not on file  . Stress: Not on file  Relationships  . Social connections:    Talks on phone: Not on file    Gets together: Not on file    Attends religious service: Not on file    Active member of club or organization: Not on file    Attends meetings of clubs or organizations: Not on file    Relationship status: Not on file  .  Intimate partner violence:    Fear of current or ex partner: Not on file    Emotionally abused: Not on file    Physically abused: Not on file    Forced sexual activity: Not on file  Other Topics Concern  . Not on file  Social History Narrative   Lives with husband and daughter in an apartment on the 3rd floor.  Currently not working.  Education: BS    Review of Systems  Constitutional: Negative.   HENT: Negative.   Eyes: Negative.   Respiratory: Negative.   Cardiovascular: Negative.   Gastrointestinal: Negative.   Genitourinary: Negative.   Musculoskeletal: Negative.   Skin: Negative.        Right breast pain radiating to under arm  Neurological: Negative.   Endo/Heme/Allergies: Negative.   Psychiatric/Behavioral: Negative.     PHYSICAL EXAMINATION:    BP 120/70   Pulse 64   Temp 98.5 F (36.9 C) (Oral)   Resp 16   Wt 147 lb (66.7 kg)   LMP 01/20/2019 (Exact Date)   BMI 22.03 kg/m     General appearance: alert, cooperative and appears stated age Breasts: patient examined supine and sitting. Pea sized lump palpated in the right breast at 12  o'clock close to the periphery, only palpated while sitting. No other lumps.  No skin changes No axillary or supraclavicular adenopathy.  Chaperone was present for exam.  ASSESSMENT Small right breast lump Mastalgia, may be MS pain    PLAN Discussed option of short term f/u vs imaging, she prefers imaging Will set up diagnostic breast imaging.    An After Visit Summary was printed and given to the patient.

## 2019-02-01 NOTE — Telephone Encounter (Signed)
Spoke with patient, requesting OV for right breast pain. Right breast pain has been intermittent for a few months, had PCP check in 10/2018, no f/u needed. Reports pain has increased over the past few days, feels like "tissue is inflamed or different". Denies fever/chills, nipple d/c, recent injury. Covid 19 screening completed, negative. OV scheduled for today at 2:30pm with Dr. Reyne Dumas. Patient verbalizes understanding and is agreeable.   Encounter closed.

## 2019-02-01 NOTE — Patient Instructions (Signed)
To try and decrease your breast pain, you should have a well fitting supportive bra, cut back on caffeine, and use ice or heat as needed. Some women find relief with the supplement evening primrose oil.    Muscle Pain, Adult Muscle pain (myalgia) may be mild or severe. In most cases, the pain lasts only a short time and it goes away without treatment. It is normal to feel some muscle pain after starting a workout program. Muscles that have not been used often will be sore at first. Muscle pain may also be caused by many other things, including:  Overuse or muscle strain, especially if you are not in shape. This is the most common cause of muscle pain.  Injury.  Bruises.  Viruses, such as the flu.  Infectious diseases.  A chronic condition that causes muscle tenderness, fatigue, and headache (fibromyalgia).  A condition, such as lupus, in which the body's disease-fighting system attacks other organs in the body (autoimmune or rheumatologic diseases).  Certain drugs, including ACE inhibitors and statins. To diagnose the cause of your muscle pain, your health care provider will do a physical exam and ask questions about the pain and when it began. If you have not had muscle pain for very long, your health care provider may want to wait before doing much testing. If your muscle pain has lasted a long time, your health care provider may want to run tests right away. In some cases, this may include tests to rule out certain conditions or illnesses. Treatment for muscle pain depends on the cause. Home care is often enough to relieve muscle pain. Your health care provider may also prescribe anti-inflammatory medicine. Follow these instructions at home: Activity  If overuse is causing your muscle pain: ? Slow down your activities until the pain goes away. ? Do regular, gentle exercises if you are not usually active. ? Warm up before exercising. Stretch before and after exercising. This can help  lower the risk of muscle pain.  Do not continue working out if the pain is very bad. Bad pain could mean that you have injured a muscle. Managing pain and discomfort   If directed, apply ice to the sore muscle: ? Put ice in a plastic bag. ? Place a towel between your skin and the bag. ? Leave the ice on for 20 minutes, 2-3 times a day.  You may also alternate between applying ice and applying heat as told by your health care provider. To apply heat, use the heat source that your health care provider recommends, such as a moist heat pack or a heating pad. ? Place a towel between your skin and the heat source. ? Leave the heat on for 20-30 minutes. ? Remove the heat if your skin turns bright red. This is especially important if you are unable to feel pain, heat, or cold. You may have a greater risk of getting burned. Medicines  Take over-the-counter and prescription medicines only as told by your health care provider.  Do not drive or use heavy machinery while taking prescription pain medicine. Contact a health care provider if:  Your muscle pain gets worse and medicines do not help.  You have muscle pain that lasts longer than 3 days.  You have a rash or fever along with muscle pain.  You have muscle pain after a tick bite.  You have muscle pain while working out, even though you are in good physical condition.  You have redness, soreness, or swelling along  with muscle pain.  You have muscle pain after starting a new medicine or changing the dose of a medicine. Get help right away if:  You have trouble breathing.  You have trouble swallowing.  You have muscle pain along with a stiff neck, fever, and vomiting.  You have severe muscle weakness or cannot move part of your body. This information is not intended to replace advice given to you by your health care provider. Make sure you discuss any questions you have with your health care provider. Document Released: 08/07/2006  Document Revised: 04/04/2016 Document Reviewed: 02/05/2016 Elsevier Interactive Patient Education  2019 [employer].

## 2019-02-01 NOTE — Progress Notes (Signed)
Patient scheduled while in office for bilateral diagnostic MMG and right breast US on 02/08/19 at 3:20pm, arrive at 3pm at [facility]. Patient verbalizes understanding and is agreeable.

## 2019-02-01 NOTE — Telephone Encounter (Signed)
Patient is having right breast pain. 

## 2019-02-08 ENCOUNTER — Ambulatory Visit
Admission: RE | Admit: 2019-02-08 | Discharge: 2019-02-08 | Disposition: A | Discharge status: Home / Self Care | Payer: BLUE CROSS/BLUE SHIELD | Source: Ambulatory Visit | Attending: Obstetrics and Gynecology | Admitting: Obstetrics and Gynecology

## 2019-02-08 ENCOUNTER — Other Ambulatory Visit: Payer: Self-pay

## 2019-02-08 DIAGNOSIS — N644 Mastodynia: Secondary | ICD-10-CM

## 2019-02-08 DIAGNOSIS — N6315 Unspecified lump in the right breast, overlapping quadrants: Secondary | ICD-10-CM

## 2019-07-20 ENCOUNTER — Other Ambulatory Visit: Payer: Self-pay | Admitting: Neurology

## 2019-07-20 NOTE — Telephone Encounter (Signed)
Requested Prescriptions   Pending Prescriptions Disp Refills  . rizatriptan (MAXALT-MLT) 10 MG disintegrating tablet [Pharmacy Med Name: Rizatriptan Benzoate 10 MG Oral Tablet Disintegrating] 9 tablet 0    Sig: DISSOLVE 1 TABLET IN MOUTH ONCE DAILY AS NEEDED FOR MIGRAINE. MAY REPEAT IN 2 HOURS IF NEEDED.   Rx last filled:12/24/18  Pt last seen:06/15/18  Follow up appt scheduled:none Denied patient needs appt

## 2019-07-25 ENCOUNTER — Telehealth: Payer: Self-pay | Admitting: Family Medicine

## 2019-07-25 NOTE — Telephone Encounter (Signed)
Pts husband came in stating that his wife needs to have several vaccines done (MMR, VARICELLA AND FLU SHOT).  Pts husband stated they need to have them done by Wednesday afternoon.  Pts husband is aware that the provider has to sign off on the vaccines and when she does we will give them a call to get them scheduled to have them done.

## 2019-07-25 NOTE — Telephone Encounter (Signed)
Please advise if pt needs an appt. Pt last ov 09/2018 for CPE

## 2019-07-25 NOTE — Telephone Encounter (Signed)
Patient's husband calling and states that the patient can get the injections at [facility]. States they will get them at the pharmacy.

## 2019-07-26 NOTE — Telephone Encounter (Signed)
If she has not had these vaccines before it is fine to proceed with vaccination. She needs to be sure she is not pregnant or planning on getting pregnant in the next 3 months.   Thanks, BJ

## 2019-07-28 NOTE — Telephone Encounter (Signed)
Spoke with pt husband states that pt has already received the immunizations at [facility]. Nothing further needed

## 2019-08-03 ENCOUNTER — Encounter: Payer: Self-pay | Admitting: Family Medicine

## 2019-08-30 ENCOUNTER — Telehealth: Payer: Self-pay | Admitting: Neurology

## 2019-08-30 MED ORDER — RIZATRIPTAN BENZOATE 10 MG PO TBDP: 10.0000 mg | ORAL_TABLET | ORAL | 3 refills | Status: DC | PRN

## 2019-08-30 NOTE — Telephone Encounter (Signed)
Refill on the rizatriptan medication 10mg  to the pharm on file- patient already called pharm and was instructed to call us. Thanks!

## 2019-08-30 NOTE — Telephone Encounter (Signed)
Requested Prescriptions   Pending Prescriptions Disp Refills  . rizatriptan (MAXALT-MLT) 10 MG disintegrating tablet 9 tablet 3    Sig: Take 1 tablet (10 mg total) by mouth as needed for migraine. May repeat in 2 hours if needed   Rx last filled: 12/24/18 #9 3 refills  Pt last seen:06/15/18   Follow up appt scheduled: none

## 2019-12-06 ENCOUNTER — Other Ambulatory Visit: Payer: Self-pay

## 2019-12-07 ENCOUNTER — Ambulatory Visit (INDEPENDENT_AMBULATORY_CARE_PROVIDER_SITE_OTHER): Payer: BC Managed Care – PPO | Admitting: Family Medicine

## 2019-12-07 ENCOUNTER — Encounter: Payer: Self-pay | Admitting: Family Medicine

## 2019-12-07 VITALS — BP 120/80 | HR 83 | Temp 97.7°F | Resp 12 | Ht 68.5 in | Wt 145.0 lb

## 2019-12-07 DIAGNOSIS — Z130 Encounter for screening for diseases of the blood and blood-forming organs and certain disorders involving the immune mechanism: Secondary | ICD-10-CM

## 2019-12-07 DIAGNOSIS — Z0000 Encounter for general adult medical examination without abnormal findings: Secondary | ICD-10-CM

## 2019-12-07 DIAGNOSIS — Z1329 Encounter for screening for other suspected endocrine disorder: Secondary | ICD-10-CM | POA: Diagnosis not present

## 2019-12-07 DIAGNOSIS — Z13228 Encounter for screening for other metabolic disorders: Secondary | ICD-10-CM | POA: Diagnosis not present

## 2019-12-07 DIAGNOSIS — Z862 Personal history of diseases of the blood and blood-forming organs and certain disorders involving the immune mechanism: Secondary | ICD-10-CM

## 2019-12-07 DIAGNOSIS — E785 Hyperlipidemia, unspecified: Secondary | ICD-10-CM

## 2019-12-07 LAB — BASIC METABOLIC PANEL
BUN: 9 mg/dL (ref 6–23)
CO2: 24 mEq/L (ref 19–32)
Calcium: 9.5 mg/dL (ref 8.4–10.5)
Chloride: 107 mEq/L (ref 96–112)
Creatinine, Ser: 0.78 mg/dL (ref 0.40–1.20)
GFR: 82.31 mL/min (ref >60.00)
Glucose, Bld: 92 mg/dL (ref 70–99)
Potassium: 4.5 mEq/L (ref 3.5–5.1)
Sodium: 139 mEq/L (ref 135–145)

## 2019-12-07 LAB — CBC
HCT: 38.3 % (ref 36.0–46.0)
Hemoglobin: 13 g/dL (ref 12.0–15.0)
MCHC: 34 g/dL (ref 30.0–36.0)
MCV: 87.1 fl (ref 78.0–100.0)
Platelets: 150 10*3/uL (ref 150.0–400.0)
RBC: 4.4 Mil/uL (ref 3.87–5.11)
RDW: 13.4 % (ref 11.5–15.5)
WBC: 3.7 10*3/uL — ABNORMAL LOW (ref 4.0–10.5)

## 2019-12-07 LAB — LIPID PANEL
Cholesterol: 206 mg/dL — ABNORMAL HIGH (ref 0–200)
HDL: 77.6 mg/dL (ref >39.00)
LDL Cholesterol: 110 mg/dL — ABNORMAL HIGH (ref 0–99)
NonHDL: 128.77
Total CHOL/HDL Ratio: 3
Triglycerides: 95 mg/dL (ref 0.0–149.0)
VLDL: 19 mg/dL (ref 0.0–40.0)

## 2019-12-07 LAB — HEMOGLOBIN A1C: Hgb A1c MFr Bld: 5.3 % (ref 4.6–6.5)

## 2019-12-07 NOTE — Progress Notes (Signed)
HPI:   Ms.Durene Shelley is a 39 y.o. female, who is here today for her routine physical.  Last CPE: 10/25/18.  Regular exercise 3 or more time per week: 4-5 weeks ago started Yoga at home. Following a healthful diet:She stopped process food,increased vegetable intake,using olive oil, and stopped dairy products. She lives with her husband and 47 yo daughter.  Sleeping about 6-7 hours,she still wakes up a few times through the nigh. No alcohol intake.  Chronic medical problems: Chronic headache,cervicalgia, and HLD among some. She follows with neurologies. Stopped Topamax and Maxalt. Last appt with neuro 06/15/18. Since she changed her diet she has had less headaches. Has headache for 2-3 days at the beginning of her menstrual cycle.  Pap smear: 02/14/18. She follows with her gyn.   Birth control:Condoms all the time.  Immunization History  Administered Date(s) Administered  . Influenza,inj,Quad PF,6+ Mos 11/03/2017, 10/25/2018, 07/25/2019  . MMR 07/25/2019  . Tdap 11/03/2017  . Varicella 07/25/2019   Mammogram: 02/08/19 because breast tenderness. Colonoscopy: N/A DEXA: N/A  She has no concerns today. She would like to have CBC done,hx of anemia. She is not on iron supplementation  HLD: She is on non pharmacologic treatment.  Component     Latest Ref Rng & Units 10/25/2018  Cholesterol     0 - 200 mg/dL 218 (H)  Triglycerides     0.0 - 149.0 mg/dL 129.0  HDL Cholesterol     >39.00 mg/dL 81.00  VLDL     0.0 - 40.0 mg/dL 25.8  LDL (calc)     0 - 99 mg/dL 111 (H)  Total CHOL/HDL Ratio      3  NonHDL      136.57   Review of Systems  Constitutional: Negative for appetite change,and fever. + Fatigue(no more than usual). HENT: Negative for dental problem, hearing loss, mouth sores, sore throat, trouble swallowing and voice change.   Eyes: Negative for redness and visual disturbance.  Respiratory: Negative for cough, shortness of breath and wheezing.     Cardiovascular: Negative for chest pain and leg swelling.  Gastrointestinal: Negative for abdominal pain, nausea and vomiting.       No changes in bowel habits.  Endocrine: Negative for cold intolerance, heat intolerance, polydipsia, polyphagia and polyuria.  Genitourinary: Negative for decreased urine volume, dysuria, hematuria, vaginal bleeding and vaginal discharge.  Musculoskeletal: Negative for arthralgias, gait problem and myalgias.  Skin: Negative for color change and rash.  Allergic/Immunologic: Positive for environmental allergies.  Neurological: Negative for syncope, weakness and speech difficulties. Hematological: Negative for adenopathy. Does not bruise/bleed easily.  Psychiatric/Behavioral: Negative for confusion and sleep disturbance. The patient is not nervous/anxious.   All other systems reviewed and are negative.  No current outpatient medications on file prior to visit.   No current facility-administered medications on file prior to visit.   Past Medical History:  Diagnosis Date  . Allergy   . Headache    History reviewed. No pertinent surgical history.  No Known Allergies  Family History  Problem Relation Age of Onset  . Colon cancer Father 42       died from colon cancer  . Cancer Father 7       colon  . Diabetes Neg Hx   . Hyperlipidemia Neg Hx   . Hypertension Neg Hx    Social History   Socioeconomic History  . Marital status: Married    Spouse name: Not on file  . Number of  children: 1  . Years of education: 35  . Highest education level: Bachelor's degree (e.g., BA, AB, BS)  Occupational History  . Not on file  Tobacco Use  . Smoking status: Never Smoker  . Smokeless tobacco: Never Used  Substance and Sexual Activity  . Alcohol use: No  . Drug use: No  . Sexual activity: Yes    Partners: Male    Birth control/protection: Condom  Other Topics Concern  . Not on file  Social History Narrative   Lives with husband and daughter in an  apartment on the 3rd floor.  Currently not working.  Education: BS   Social Determinants of Health   Financial Resource Strain:   . Difficulty of Paying Living Expenses: Not on file  Food Insecurity:   . Worried About Charity fundraiser in the Last Year: Not on file  . Ran Out of Food in the Last Year: Not on file  Transportation Needs:   . Lack of Transportation (Medical): Not on file  . Lack of Transportation (Non-Medical): Not on file  Physical Activity:   . Days of Exercise per Week: Not on file  . Minutes of Exercise per Session: Not on file  Stress:   . Feeling of Stress : Not on file  Social Connections:   . Frequency of Communication with Friends and Family: Not on file  . Frequency of Social Gatherings with Friends and Family: Not on file  . Attends Religious Services: Not on file  . Active Member of Clubs or Organizations: Not on file  . Attends Archivist Meetings: Not on file  . Marital Status: Not on file    Vitals:   12/07/19 0656  BP: 120/80  Pulse: 83  Resp: 12  Temp: 97.7 F (36.5 C)  SpO2: 99%   Body mass index is 21.73 kg/m.   Wt Readings from Last 3 Encounters:  12/07/19 145 lb (65.8 kg)  02/01/19 147 lb (66.7 kg)  10/25/18 147 lb (66.7 kg)    Physical Exam  Nursing note and vitals reviewed. Constitutional: She is oriented to person, place, and time. She appears well-developed and well-nourished. No distress.  HENT:  Head: Normocephalic and atraumatic.  Right Ear: Hearing, tympanic membrane, external ear and ear canal normal.  Left Ear: Hearing, tympanic membrane, external ear and ear canal normal.  Mouth/Throat: Uvula is midline, oropharynx is clear and moist and mucous membranes are normal.  Eyes: Pupils are equal, round, and reactive to light. Conjunctivae and EOM are normal.  Neck: No tracheal deviation present. No thyromegaly present.  Cardiovascular: Normal rate and regular rhythm.  No murmur heard. Pulses:      Dorsalis  pedis pulses are 2+ on the right side, and 2+ on the left side.  Respiratory: Effort normal and breath sounds normal. No respiratory distress.  GI: Soft. She exhibits no mass. There is no hepatomegaly. There is no tenderness.  Genitourinary:Comments: Deferred to gyn.  Musculoskeletal: She exhibits no edema.  No major deformity or signs of synovitis appreciated.  Lymphadenopathy:    She has no cervical adenopathy.       Right: No supraclavicular adenopathy present.       Left: No supraclavicular adenopathy present.  Neurological: She is alert and oriented to person, place, and time. She has normal strength. No cranial nerve deficit. Coordination and gait normal.  Reflex Scores:      Bicep reflexes are 2+ on the right side and 2+ on the left side.  Patellar reflexes are 2+ on the right side and 2+ on the left side. Skin: Skin is warm. No rash noted. No erythema.  Psychiatric: She has a normal mood and affect. Cognitive function grossly intact. Well groomed, good eye contact.   ASSESSMENT AND PLAN:  Ms. Harriett Azar was here today annual physical examination.  Orders Placed This Encounter  Procedures  . Basic metabolic panel  . Hemoglobin A1c  . Lipid panel  . CBC    Lab Results  Component Value Date   CHOL 206 (H) 12/07/2019   HDL 77.60 12/07/2019   LDLCALC 110 (H) 12/07/2019   TRIG 95.0 12/07/2019   CHOLHDL 3 12/07/2019   Lab Results  Component Value Date   WBC 3.7 (L) 12/07/2019   HGB 13.0 12/07/2019   HCT 38.3 12/07/2019   MCV 87.1 12/07/2019   PLT 150.0 12/07/2019   Lab Results  Component Value Date   HGBA1C 5.3 12/07/2019    Routine general medical examination at a health care facility We discussed the importance of regular physical activity and healthy diet for prevention of chronic illness and/or complications. Preventive guidelines reviewed. Vaccination up to date. She will continue following with gynecologist for her female preventive  care.  Next CPE in a year.  Hyperlipidemia, unspecified hyperlipidemia type Continue low fat diet. Further recommendations will be given according to FLP results.  -     Lipid panel  Screening for endocrine, metabolic and immunity disorder -     Basic metabolic panel -     Hemoglobin A1c  History of anemia -     CBC    Return in 1 year (on 12/06/2020).    Nuriyah Hanline G. Martinique, MD  [facility]. [place] office.

## 2019-12-07 NOTE — Patient Instructions (Addendum)
Today you have you routine preventive visit.  Routine general medical examination at a health care facility  Hyperlipidemia, unspecified hyperlipidemia type - Plan: Lipid panel  Screening for endocrine, metabolic and immunity disorder - Plan: Basic metabolic panel, Hemoglobin A1c   At least 150 minutes of moderate exercise per week, daily brisk walking for 15-30 min is a good exercise option. Healthy diet low in saturated (animal) fats and sweets and consisting of fresh fruits and vegetables, lean meats such as fish and white chicken and whole grains.  These are some of recommendations for screening depending of age and risk factors:  - Vaccines:  Tdap vaccine every 10 years.  Shingles vaccine recommended at age 19, could be given after 39 years of age but not sure about insurance coverage.   Pneumonia vaccines: Pneumovax at 65. Sometimes Pneumovax is giving earlier if history of smoking, lung disease,diabetes,kidney disease among some.  Screening for diabetes at age 30 and every 3 years.  Cervical cancer prevention:  Pap smear starts at 39 years of age and continues periodically until 39 years old in low risk women. Pap smear every 3 years between 24 and 47 years old. Pap smear every 3-5 years between women 30 and older if pap smear negative and HPV screening negative.   -Breast cancer: Mammogram: There is disagreement between experts about when to start screening in low risk asymptomatic female but recent recommendations are to start screening at 32 and not later than 39 years old , every 1-2 years and after 39 yo q 2 years. Screening is recommended until 39 years old but some women can continue screening depending of healthy issues.  Colon cancer screening: starts at 39 years old until 39 years old.  Cholesterol disorder screening at age 33 and every 3 years.N/A  Also recommended:  1. Dental visit- Brush and floss your teeth twice daily; visit your dentist twice a  year. 2. Eye doctor- Get an eye exam at least every 2 years. 3. Helmet use- Always wear a helmet when riding a bicycle, motorcycle, rollerblading or skateboarding. 4. Safe sex- If you may be exposed to sexually transmitted infections, use a condom. 5. Seat belts- Seat belts can save your live; always wear one. 6. Smoke/Carbon Monoxide detectors- These detectors need to be installed on the appropriate level of your home. Replace batteries at least once a year. 7. Skin cancer- When out in the sun please cover up and use sunscreen 15 SPF or higher. 8. Violence- If anyone is threatening or hurting you, please tell your healthcare provider.  9. Drink alcohol in moderation- Limit alcohol intake to one drink or less per day. Never drink and drive.

## 2019-12-16 ENCOUNTER — Encounter: Payer: Self-pay | Admitting: Certified Nurse Midwife

## 2020-03-05 ENCOUNTER — Encounter: Payer: Self-pay | Admitting: Family Medicine

## 2020-08-21 ENCOUNTER — Telehealth: Payer: Self-pay

## 2020-08-21 ENCOUNTER — Other Ambulatory Visit: Payer: Self-pay | Admitting: Obstetrics and Gynecology

## 2020-08-21 ENCOUNTER — Ambulatory Visit (INDEPENDENT_AMBULATORY_CARE_PROVIDER_SITE_OTHER): Payer: BC Managed Care – PPO | Admitting: Obstetrics and Gynecology

## 2020-08-21 ENCOUNTER — Other Ambulatory Visit: Payer: Self-pay

## 2020-08-21 ENCOUNTER — Encounter: Payer: Self-pay | Admitting: Obstetrics and Gynecology

## 2020-08-21 ENCOUNTER — Telehealth: Payer: Self-pay | Admitting: Genetic Counselor

## 2020-08-21 VITALS — BP 134/78 | HR 99 | Ht 68.5 in | Wt 139.0 lb

## 2020-08-21 DIAGNOSIS — N6315 Unspecified lump in the right breast, overlapping quadrants: Secondary | ICD-10-CM

## 2020-08-21 DIAGNOSIS — Z01419 Encounter for gynecological examination (general) (routine) without abnormal findings: Secondary | ICD-10-CM

## 2020-08-21 DIAGNOSIS — Z8639 Personal history of other endocrine, nutritional and metabolic disease: Secondary | ICD-10-CM | POA: Diagnosis not present

## 2020-08-21 DIAGNOSIS — N644 Mastodynia: Secondary | ICD-10-CM

## 2020-08-21 DIAGNOSIS — Z800 Family history of malignant neoplasm of digestive organs: Secondary | ICD-10-CM

## 2020-08-21 NOTE — Telephone Encounter (Signed)
Call placed to [facility] and pt scheduled for Dx Bil MMG and Right breast US on 09/26/20 at 840 am. Will call pt with information.  Call placed to pt. Spoke with pt.  Pt advised of appt with TBC. Pt agreeable to date and time of appt. Pt advised can call every day for earlier appt. Pt advised referral placed to Dr Dwain Sarna for breast surgeon and will need consult after diagnostic imaging. Pt verbalized understanding.   Routing to Dr Oscar La for update Cc: Clotilde Dieter for referral. Order placed  Cc: Noreene Larsson, RN for [facility] hold Encounter closed

## 2020-08-21 NOTE — Telephone Encounter (Signed)
Scheduled appointment per 11/23 referral. Spoke to patient who is aware of appointment date and time.  

## 2020-08-21 NOTE — Telephone Encounter (Signed)
Patient placed in MMG hold.  

## 2020-08-21 NOTE — Progress Notes (Signed)
39 y.o. [id number] Married Unavailable Not Hispanic or Latino female here for annual exam.  She has a lump in her right breast that bothers her during her cycle she had a mammography in 2020 with benign findings. It hasn't grown, but it is bothering her more.  Period Cycle (Days): 28 Period Duration (Days): 5 Period Pattern: Regular Menstrual Flow: Moderate, Light Menstrual Control: Other (Comment) (period cup) Dysmenorrhea: (!) Moderate Dysmenorrhea Symptoms: Cramping, Headache (headache severe)   She has changed her diet for her migraines and it has helped (not gone), no dairy.   Patient's last menstrual period was 08/02/2020.          Sexually active: Yes.    The current method of family planning is condoms always.    Exercising: No.  The patient does not participate in regular exercise at present. Smoker:  no  Health Maintenance: Pap:  01/27/2018 WNL  HR HPV Neg  History of abnormal Pap:  no MMG:  02/08/2019 density C Bi-rads 2 benign  BMD:   None  Colonoscopy:  TDaP:  11/03/2017  Gardasil: none    reports that she has never smoked. She has never used smokeless tobacco. She reports that she does not drink alcohol and does not use drugs. 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 Her husband is a Museum/gallery exhibitions officer, studies bees. They are moving out of state for a new job.  Past Medical History:  Diagnosis Date  . Allergy   . Headache     History reviewed. No pertinent surgical history.  Current Outpatient Medications  Medication Sig Dispense Refill  . rizatriptan (MAXALT-MLT) 10 MG disintegrating tablet Take by mouth.     No current facility-administered medications for this visit.    Family History  Problem Relation Age of Onset  . Colon cancer Father 75       died from colon cancer  . Cancer Father 24       colon  . Diabetes Neg Hx   . Hyperlipidemia Neg Hx   . Hypertension Neg Hx   Dad with colon cancer at 3, died at 37. Paternal aunt with some time of digestive cancer in her  47's.   Review of Systems  All other systems reviewed and are negative.   Exam:   BP 134/78   Pulse 99   Ht 5' 8.5" (1.74 m)   Wt 139 lb (63 kg)   LMP 08/02/2020   SpO2 100%   BMI 20.83 kg/m   Weight change: @WEIGHTCHANGE @ Height:   Height: 5' 8.5" (174 cm)  Ht Readings from Last 3 Encounters:  08/21/20 5' 8.5" (1.74 m)  12/07/19 5' 8.5" (1.74 m)  10/25/18 5' 8.5" (1.74 m)    General appearance: alert, cooperative and appears stated age Head: Normocephalic, without obvious abnormality, atraumatic Neck: no adenopathy, supple, symmetrical, trachea midline and thyroid normal to inspection and palpation Lungs: clear to auscultation bilaterally Cardiovascular: regular rate and rhythm Breasts: in the right breast at 12 o'clock is a nodular feeling lump, slightly irregular, <1 cm (palpated best when sitting). No other breast lumps. No skin changes.   Abdomen: soft, non-tender; non distended,  no masses,  no organomegaly Extremities: extremities normal, atraumatic, no cyanosis or edema Skin: Skin color, texture, turgor normal. No rashes or lesions Lymph nodes: Cervical, supraclavicular, and axillary nodes normal. No abnormal inguinal nodes palpated Neurologic: Grossly normal   Pelvic: External genitalia:  no lesions              Urethra:  normal appearing urethra with no masses, tenderness or lesions              Bartholins and Skenes: normal                 Vagina: normal appearing vagina with normal color and discharge, no lesions              Cervix: no lesions               Bimanual Exam:  Uterus:  normal size, contour, position, consistency, mobility, non-tender              Adnexa: no mass, fullness, tenderness               Rectovaginal: Confirms               Anus:  normal sphincter tone, no lesions  Shanon Petty chaperoned for the exam.  A:  Well Woman with normal exam  Right breast lump, more tender and bothersome than when evaluated 1.5 years ago.   FH colon  cancer, dad diagnosed at 15 and died at 61  P:   Labs UTD with primary  Diagnostic left breast imaging  Discussed breast self exam  Discussed calcium and vit D intake  Referral to genetic counselor  Will likely refer to GI for colonoscopy after her genetics evaluation  Pap up to date

## 2020-08-21 NOTE — Patient Instructions (Signed)
EXERCISE   We recommended that you start or continue a regular exercise program for good health. Physical activity is anything that gets your body moving, some is better than none. The CDC recommends 150 minutes per week of Moderate-Intensity Aerobic Activity and 2 or more days of Muscle Strengthening Activity.  Benefits of exercise are limitless: helps weight loss/weight maintenance, improves mood and energy, helps with depression and anxiety, improves sleep, tones and strengthens muscles, improves balance, improves bone density, protects from chronic conditions such as heart disease, high blood pressure and diabetes and so much more. To learn more visit: http://kirby-bean.org/  DIET: Good nutrition starts with a healthy diet of fruits, vegetables, whole grains, and lean protein sources. Drink plenty of water for hydration. Minimize empty calories, sodium, sweets. For more information about dietary recommendations visit: CriticalGas.be and https://www.carpenter-henry.info/  ALCOHOL:  Women should limit their alcohol intake to no more than 7 drinks/beers/glasses of wine (combined, not each!) per week. Moderation of alcohol intake to this level decreases your risk of breast cancer and liver damage.  If you are concerned that you may have a problem, or your friends have told you they are concerned about your drinking, there are many resources to help. A well-known program that is free, effective, and available to all people all over the nation is Alcoholics Anonymous.  Check out this site to learn more: BeverageBargains.co.za  SMOKING:   CALCIUM AND VITAMIN D:  Adequate intake of calcium and Vitamin D are recommended for bone health.  The recommendations for exact amounts of these supplements seem to change often, but generally speaking 1000-1500 mg of calcium (between diet and supplement) and 800 units of Vitamin D per day seems  prudent.     PAP SMEARS:  Pap smears, to check for cervical cancer or precancers,  have traditionally been done yearly, although recent scientific advances have shown that most women can have pap smears less often.  However, every woman still should have a physical exam from her gynecologist every year. It will include a breast check, inspection of the vulva and vagina to check for abnormal growths or skin changes, a visual exam of the cervix, and then an exam to evaluate the size and shape of the uterus and ovaries.  And after 39 years of age, a rectal exam is indicated to check for rectal cancers. We will also provide age appropriate advice regarding health maintenance, like when you should have certain vaccines, screening for sexually transmitted diseases, bone density testing, colonoscopy, mammograms, etc.   MAMMOGRAMS:  All women over 66 years old should have a routine mammogram.   COLON CANCER SCREENING: Now recommend starting at age 5. At this time colonoscopy is not covered for routine screening until 50. There are take home tests that can be done between 45-49.   COLONOSCOPY:  Colonoscopy to screen for colon cancer is recommended for all women at age 66.  We know, you hate the idea of the prep.  We agree, BUT, having colon cancer and not knowing it is worse!!  Colon cancer so often starts as a polyp that can be seen and removed at colonscopy, which can quite literally save your life!  And if your first colonoscopy is normal and you have no family history of colon cancer, most women don't have to have it again for 10 years.  Once every ten years, you can do something that may end up saving your life, right?  We will be happy to help you get it  scheduled when you are ready.  Be sure to check your insurance coverage so you understand how much it will cost.  It may be covered as a preventative service at no cost, but you should check your particular policy.      Breast Self-Awareness Breast  self-awareness means being familiar with how your breasts look and feel. It involves checking your breasts regularly and reporting any changes to your health care provider. Practicing breast self-awareness is important. A change in your breasts can be a sign of a serious medical problem. Being familiar with how your breasts look and feel allows you to find any problems early, when treatment is more likely to be successful. All women should practice breast self-awareness, including women who have had breast implants. How to do a breast self-exam One way to learn what is normal for your breasts and whether your breasts are changing is to do a breast self-exam. To do a breast self-exam: Look for Changes  1. Remove all the clothing above your waist. 2. Stand in front of a mirror in a room with good lighting. 3. Put your hands on your hips. 4. Push your hands firmly downward. 5. Compare your breasts in the mirror. Look for differences between them (asymmetry), such as: ? Differences in shape. ? Differences in size. ? Puckers, dips, and bumps in one breast and not the other. 6. Look at each breast for changes in your skin, such as: ? Redness. ? Scaly areas. 7. Look for changes in your nipples, such as: ? Discharge. ? Bleeding. ? Dimpling. ? Redness. ? A change in position. Feel for Changes Carefully feel your breasts for lumps and changes. It is best to do this while lying on your back on the floor and again while sitting or standing in the shower or tub with soapy water on your skin. Feel each breast in the following way:  Place the arm on the side of the breast you are examining above your head.  Feel your breast with the other hand.  Start in the nipple area and make  inch (2 cm) overlapping circles to feel your breast. Use the pads of your three middle fingers to do this. Apply light pressure, then medium pressure, then firm pressure. The light pressure will allow you to feel the tissue  closest to the skin. The medium pressure will allow you to feel the tissue that is a little deeper. The firm pressure will allow you to feel the tissue close to the ribs.  Continue the overlapping circles, moving downward over the breast until you feel your ribs below your breast.  Move one finger-width toward the center of the body. Continue to use the  inch (2 cm) overlapping circles to feel your breast as you move slowly up toward your collarbone.  Continue the up and down exam using all three pressures until you reach your armpit.  Write Down What You Find  Write down what is normal for each breast and any changes that you find. Keep a written record with breast changes or normal findings for each breast. By writing this information down, you do not need to depend only on memory for size, tenderness, or location. Write down where you are in your menstrual cycle, if you are still menstruating. If you are having trouble noticing differences in your breasts, do not get discouraged. With time you will become more familiar with the variations in your breasts and more comfortable with the exam. How often should  I examine my breasts? Examine your breasts every month. If you are breastfeeding, the best time to examine your breasts is after a feeding or after using a breast pump. If you menstruate, the best time to examine your breasts is 5-7 days after your period is over. During your period, your breasts are lumpier, and it may be more difficult to notice changes. When should I see my health care provider? See your health care provider if you notice:  A change in shape or size of your breasts or nipples.  A change in the skin of your breast or nipples, such as a reddened or scaly area.  Unusual discharge from your nipples.  A lump or thick area that was not there before.  Pain in your breasts.  Anything that concerns you.

## 2020-08-21 NOTE — Telephone Encounter (Signed)
-----   Message from Romualdo Bolk, MD sent at 08/21/2020 11:49 AM EST ----- This patient has a lump in her right breast at 12 o'clock near the periphery, it has become very tender/uncomfortable for her and she would like it removed. Negative imaging in 5/20, but pain is new. Please set her up for diagnostic imaging on the left (don't know if she needs bilateral). She then needs referral to a breast surgeon. Thanks, Noreene Larsson

## 2020-08-22 LAB — VITAMIN D 25 HYDROXY (VIT D DEFICIENCY, FRACTURES): Vit D, 25-Hydroxy: 12.6 ng/mL — ABNORMAL LOW (ref 30.0–100.0)

## 2020-08-27 ENCOUNTER — Encounter: Payer: Self-pay | Admitting: Obstetrics and Gynecology

## 2020-08-30 ENCOUNTER — Other Ambulatory Visit: Payer: Self-pay

## 2020-08-30 DIAGNOSIS — R7989 Other specified abnormal findings of blood chemistry: Secondary | ICD-10-CM

## 2020-08-30 MED ORDER — VITAMIN D (ERGOCALCIFEROL) 1.25 MG (50000 UNIT) PO CAPS: 50000.0000 [IU] | ORAL_CAPSULE | ORAL | 0 refills | Status: DC

## 2020-09-11 ENCOUNTER — Other Ambulatory Visit: Payer: Self-pay | Admitting: Obstetrics and Gynecology

## 2020-09-11 ENCOUNTER — Ambulatory Visit
Admission: RE | Admit: 2020-09-11 | Discharge: 2020-09-11 | Disposition: A | Discharge status: Home / Self Care | Payer: BC Managed Care – PPO | Source: Ambulatory Visit | Attending: Obstetrics and Gynecology | Admitting: Obstetrics and Gynecology

## 2020-09-11 ENCOUNTER — Other Ambulatory Visit: Payer: Self-pay

## 2020-09-11 ENCOUNTER — Telehealth: Payer: Self-pay | Admitting: Obstetrics and Gynecology

## 2020-09-11 DIAGNOSIS — N6315 Unspecified lump in the right breast, overlapping quadrants: Secondary | ICD-10-CM

## 2020-09-11 NOTE — Telephone Encounter (Signed)
Have her try taking 5,000 IU of vit d 3

## 2020-09-11 NOTE — Telephone Encounter (Signed)
-----   Message from Romualdo Bolk, MD sent at 09/11/2020  4:29 PM EST ----- Breast imaging is benign. The patient has breast pain from the lump. Please confirm that she would like referral to a breast surgeon for removal and then place the referral.

## 2020-09-11 NOTE — Telephone Encounter (Signed)
Spoke with Kathleen Ruiz. Kathleen Ruiz given update and recommendations per Dr Oscar La. Kathleen Ruiz agreeable and verbalized understanding. Kathleen Ruiz advised to call with any additional questions or concerns.  Encounter closed

## 2020-09-11 NOTE — Telephone Encounter (Signed)
Patient has been having headaches since staring vitamin d. She would like to discuss with Dr.Jertson.

## 2020-09-11 NOTE — Telephone Encounter (Signed)
Spoke with pt. Pt given results on Dx MMG per Dr Oscar La. Pt verbalized understanding and has appt on 10/05/20 with Dr Dwain Sarna for breast surgeon consult. Advised will give update to Dr Oscar La. Pt agreeable.   Pt asking about Vit D Rx and having recent headaches since taking. Pt states started taking Vit D 50 K Rx x 2 weeks ago and had headaches after 2 days and took Maxalt Rx on 12/7 and 12/10 which did not help resolve.   Routing to Dr Oscar La for update and recommendations.

## 2020-09-11 NOTE — Telephone Encounter (Signed)
Call to patient. Patient states she takes her vitamin D on Saturdays. Two days after the first dose, patient states she got a headache that lasted 4-5 days. Took the second dose this past Saturday and got a headache that afternoon. Has a history of migraines and has taken maxalt and magnesium citrate and the headache has gotten better, but has not resolved. Patient states nothing else has changed in her diet or daily routine to be causing the headaches. Patient asking for Dr. Salli Quarry recommendations? RN advised would send message to Dr. Oscar La and return call with recommendations. Patient agreeable.   Routing to provider for review.

## 2020-09-13 ENCOUNTER — Other Ambulatory Visit: Payer: Self-pay

## 2020-09-13 ENCOUNTER — Inpatient Hospital Stay: Payer: BC Managed Care – PPO | Attending: Obstetrics and Gynecology | Admitting: Genetic Counselor

## 2020-09-13 ENCOUNTER — Inpatient Hospital Stay: Payer: BC Managed Care – PPO

## 2020-09-13 DIAGNOSIS — Z800 Family history of malignant neoplasm of digestive organs: Secondary | ICD-10-CM

## 2020-09-13 DIAGNOSIS — Z1379 Encounter for other screening for genetic and chromosomal anomalies: Secondary | ICD-10-CM

## 2020-09-13 DIAGNOSIS — Z8051 Family history of malignant neoplasm of kidney: Secondary | ICD-10-CM

## 2020-09-14 ENCOUNTER — Encounter: Payer: Self-pay | Admitting: Genetic Counselor

## 2020-09-14 DIAGNOSIS — Z8051 Family history of malignant neoplasm of kidney: Secondary | ICD-10-CM | POA: Insufficient documentation

## 2020-09-14 DIAGNOSIS — Z800 Family history of malignant neoplasm of digestive organs: Secondary | ICD-10-CM | POA: Insufficient documentation

## 2020-09-14 NOTE — Progress Notes (Signed)
REFERRING PROVIDER: Salvadore Dom, [address] [place],   [id number]  PRIMARY PROVIDER:  Martinique, Betty G, MD  PRIMARY REASON FOR VISIT:  1. Family history of colon cancer   2. Family history of stomach cancer   3. Family history of kidney cancer      HISTORY OF PRESENT ILLNESS:   Kathleen Ruiz, a 39 y.o. female, was seen for a Kathleen Ruiz at the request of Dr. Talbert Nan due to a family history of cancer.  Kathleen Ruiz presents to clinic today to discuss the possibility of a hereditary predisposition to cancer, genetic testing, and to further clarify her future cancer risks, as well as potential cancer risks for family members.   Kathleen Ruiz does not have a personal history of cancer.    RISK FACTORS:  Menarche was at age 58.  First live birth at age 37.  No OCP use.  Ovaries intact: yes.  Hysterectomy: no.  Menopausal status: premenopausal.  HRT use: 0 years. Colonoscopy: no. Mammogram within the last year: yes. Number of breast biopsies: 0. Any excessive radiation exposure in the past: no.   Past Medical History:  Diagnosis Date  . Allergy   . Family history of colon cancer   . Family history of kidney cancer   . Family history of stomach cancer   . Headache     No past surgical history on file.  Social History   Socioeconomic History  . Marital status: Married    Spouse name: Not on file  . Number of children: 1  . Years of education: 63  . Highest education level: Bachelor's degree (e.g., BA, AB, BS)  Occupational History  . Not on file  Tobacco Use  . Smoking status: Never Smoker  . Smokeless tobacco: Never Used  Vaping Use  . Vaping Use: Never used  Substance and Sexual Activity  . Alcohol use: No  . Drug use: No  . Sexual activity: Yes    Partners: Male    Birth control/protection: Condom  Other Topics Concern  . Not on file  Social History Narrative   Lives with husband and daughter  in an apartment on the 3rd floor.  Currently not working.  Education: BS   Social Determinants of Radio broadcast assistant Strain: Not on file  Food Insecurity: Not on file  Transportation Needs: Not on file  Physical Activity: Not on file  Stress: Not on file  Social Connections: Not on file     FAMILY HISTORY:  We obtained a detailed, 4-generation family history.  Significant diagnoses are listed below: Family History  Problem Relation Age of Onset  . Colon cancer Father 17       died from colon cancer  . Kidney cancer Paternal Uncle        dx unknown age  . Stomach cancer Paternal Aunt 13  . Diabetes Neg Hx   . Hyperlipidemia Neg Hx   . Hypertension Neg Hx    Kathleen Ruiz has one daughter (age 23). She has one brother and two sisters. None of these family members have had cancer.  Kathleen Ruiz mother is 69 and has not had cancer. Kathleen Ruiz has three maternal uncles and two maternal aunts. There are no known cancer diagnoses among maternal first cousins. Her maternal grandmother died at age 70 and her maternal grandfather died at age 48.   Kathleen Ruiz father died at age 18 from colon cancer (diagnosed age  61). She had five paternal uncles and three paternal aunts. One aunt died from stomach cancer at age 49. One uncle has a history of kidney cancer (unknown age of diagnosis). There are no known cancer diagnoses among paternal first cousins. Her paternal grandmother died at age 28 and had Alzheimers, and her paternal grandfather died at age 69.  Kathleen Ruiz is unaware of previous family history of genetic testing for hereditary cancer risks. Patient's maternal ancestors are of [place] descent, and paternal ancestors are of [place] descent. There is no reported Ashkenazi Jewish ancestry. There is no known consanguinity.  GENETIC COUNSELING ASSESSMENT: Kathleen Ruiz is a 39 y.o. female with a family history of young-onset colon cancer, stomach cancer, and kidney  cancer, which is somewhat suggestive of a hereditary cancer syndrome and predisposition to cancer. We, therefore, discussed and recommended the following at today's visit.   DISCUSSION: We discussed that approximately 5-10% of cancer is hereditary, meaning that it is due to a mutation in a single gene that is passed down from generation to generation in a family. Most hereditary cases of colon cancer cancer are associated with Lynch syndrome - a genetic condition that increases the risk for multiple cancer types. There are other genes that can be associated an increased risk for colon cancer, including APC, MUTYH, CHEK2, etc. We discussed that testing is beneficial for several reasons, including knowing about other cancer risks, identifying potential screening and risk-reduction options that may be appropriate, and to understand if other family members could be at risk for cancer and allow them to undergo genetic testing.  We reviewed the characteristics, features and inheritance patterns of hereditary cancer syndromes. We also discussed genetic testing, including the appropriate family members to test, the process of testing, insurance coverage and turn-around-time for results. We discussed the implications of a negative, positive and/or variant of uncertain significant result. We recommended Kathleen Ruiz pursue genetic testing for the [employer] CancerNext-Expanded + RNAinsight gene panel.   The CancerNext-Expanded + RNAinsight gene panel offered by [employer] and includes sequencing and rearrangement analysis for the following 77 genes: AIP, ALK, APC, ATM, AXIN2, BAP1, BARD1, BLM, BMPR1A, BRCA1, BRCA2, BRIP1, CDC73, CDH1, CDK4, CDKN1B, CDKN2A, CHEK2, CTNNA1, DICER1, FANCC, FH, FLCN, GALNT12, KIF1B, LZTR1, MAX, MEN1, MET, MLH1, MSH2, MSH3, MSH6, MUTYH, NBN, NF1, NF2, NTHL1, PALB2, PHOX2B, PMS2, POT1, PRKAR1A, PTCH1, PTEN, RAD51C, RAD51D, RB1, RECQL, RET, SDHA, SDHAF2, SDHB, SDHC, SDHD, SMAD4, SMARCA4,  SMARCB1, SMARCE1, STK11, SUFU, TMEM127, TP53, TSC1, TSC2, VHL and XRCC2 (sequencing and deletion/duplication); EGFR, EGLN1, HOXB13, KIT, MITF, PDGFRA, POLD1 and POLE (sequencing only); EPCAM and GREM1 (deletion/duplication only). RNA data is routinely analyzed for use in variant interpretation for all genes.  Based on Ms. Olund's family history of cancer, she meets medical criteria for genetic testing. Despite that she meets criteria, there may still be an out of pocket cost. We discussed that if her out of pocket cost for testing is over $100, the laboratory will reach out to let her know. If the out of pocket cost of testing is less than $100 she will be billed by the genetic testing laboratory.   We discussed that some people do not want to undergo genetic testing due to fear of genetic discrimination.  A federal law called the Genetic Information Non-Discrimination Act (GINA) of 2008 helps protect individuals against genetic discrimination based on their genetic test results.  It impacts both health insurance and employment.  With health insurance, it protects against increased premiums, being kicked off insurance  or being forced to take a test in order to be insured.  For employment it protects against hiring, firing and promoting decisions based on genetic test results. Health status due to a cancer diagnosis is not protected under GINA. Importantly, life, disability, and long-term care insurance is not protected under GINA. Ms. Montroy would like some time to discuss this information with her husband before proceeding with genetic testing.  Based on the patient's personal and family history, a statistical model Midwife) was used to estimate her risk of developing breast cancer. Tyrer-Cuzick estimates her lifetime risk of developing breast cancer to be approximately 13.4%. This estimation does not consider any genetic testing results. The patient's lifetime breast cancer risk is a  preliminary estimate based on available information using one of several models endorsed by the [employer] (ACS). The ACS recommends consideration of breast MRI screening as an adjunct to mammography for patients at high risk (defined as 20% or greater lifetime risk).      PLAN:  Ms. Whirley did not wish to pursue genetic testing at today's visit, as she would like to discuss life/long-term care/disability insurance discrimination with her husband first. We understand this decision and remain available to coordinate genetic testing at any time in the future. We, therefore, recommend Ms. Christiano continue to follow the cancer screening guidelines given by her primary healthcare provider.  Ms. Opfer questions were answered to her satisfaction today. Our contact information was provided should additional questions or concerns arise. Thank you for the referral and allowing [place] to share in the care of your patient.   Clint Guy, [place], [facility] Licensed, Certified Dispensing [email or web address] Phone: [phone]  The patient was seen for a total of 40 minutes in face-to-face genetic counseling.  This patient was discussed with Drs. Magrinat, Lindi Adie and/or Burr Medico who agrees with the above.    _______________________________________________________________________ For Office Staff:  Number of people involved in session: 1 Was an Intern/ student involved with case: no

## 2020-09-26 ENCOUNTER — Other Ambulatory Visit: Payer: BC Managed Care – PPO

## 2020-09-26 ENCOUNTER — Ambulatory Visit: Payer: BLUE CROSS/BLUE SHIELD | Admitting: Obstetrics and Gynecology

## 2020-11-19 ENCOUNTER — Other Ambulatory Visit: Payer: BC Managed Care – PPO

## 2020-12-14 ENCOUNTER — Other Ambulatory Visit (INDEPENDENT_AMBULATORY_CARE_PROVIDER_SITE_OTHER): Payer: BC Managed Care – PPO

## 2020-12-14 ENCOUNTER — Other Ambulatory Visit: Payer: Self-pay

## 2020-12-14 DIAGNOSIS — R7989 Other specified abnormal findings of blood chemistry: Secondary | ICD-10-CM

## 2020-12-15 LAB — VITAMIN D 25 HYDROXY (VIT D DEFICIENCY, FRACTURES): Vit D, 25-Hydroxy: 18.7 ng/mL — ABNORMAL LOW (ref 30.0–100.0)

## 2020-12-18 ENCOUNTER — Other Ambulatory Visit: Payer: Self-pay | Admitting: *Deleted

## 2020-12-18 MED ORDER — VITAMIN D (ERGOCALCIFEROL) 1.25 MG (50000 UNIT) PO CAPS: ORAL_CAPSULE | ORAL | 0 refills | Status: AC

## 2020-12-18 NOTE — Telephone Encounter (Signed)
Pt states she will be out of town cannot get repeat lab work at requested time. Pt will make appt when she comes back to have her blood work drawn

## 2021-02-07 ENCOUNTER — Telehealth: Payer: Self-pay | Admitting: Obstetrics and Gynecology

## 2021-02-07 NOTE — Telephone Encounter (Signed)
Please let the patient know that I got a copy of her visit with the breast surgeon. I agree with him that she should have a colonoscopy. If she is okay with it, please place a referral to Dr Leone Payor in GI.

## 2021-02-11 NOTE — Telephone Encounter (Signed)
Patient informed with below note, she declined at this time now. She will call if she wishes to proceed.

## 2021-02-11 NOTE — Telephone Encounter (Signed)
Left message for patient to call.

## 2021-08-02 IMAGING — MG MM DIGITAL DIAGNOSTIC UNILAT*R* W/ TOMO W/ CAD
6 series · 6 of 18 positions shown · non-contrast
Comparison: Previous exam(s).

CLINICAL DATA: Palpable abnormality in the RIGHT breast, first
mass in the same location.

EXAM:
DIGITAL DIAGNOSTIC RIGHT MAMMOGRAM WITH CAD AND TOMO
ULTRASOUND RIGHT BREAST

[R CC synth-2D]
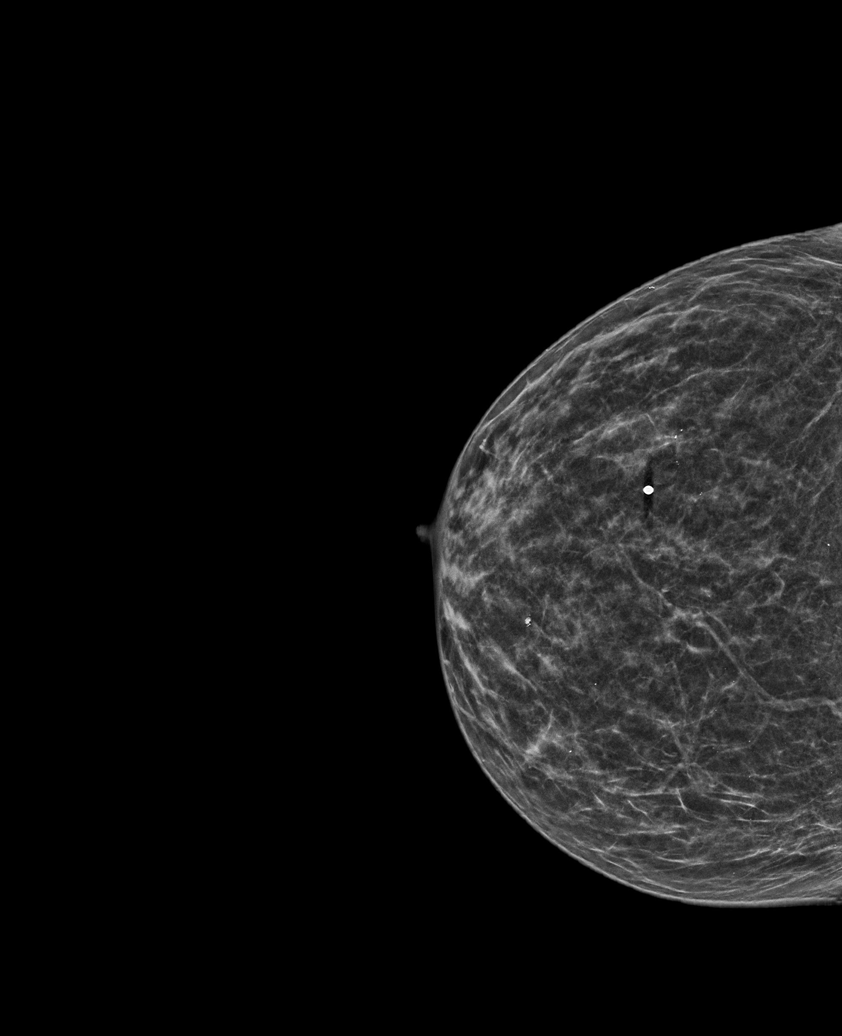

[R MLO synth-2D (1 of 2)]
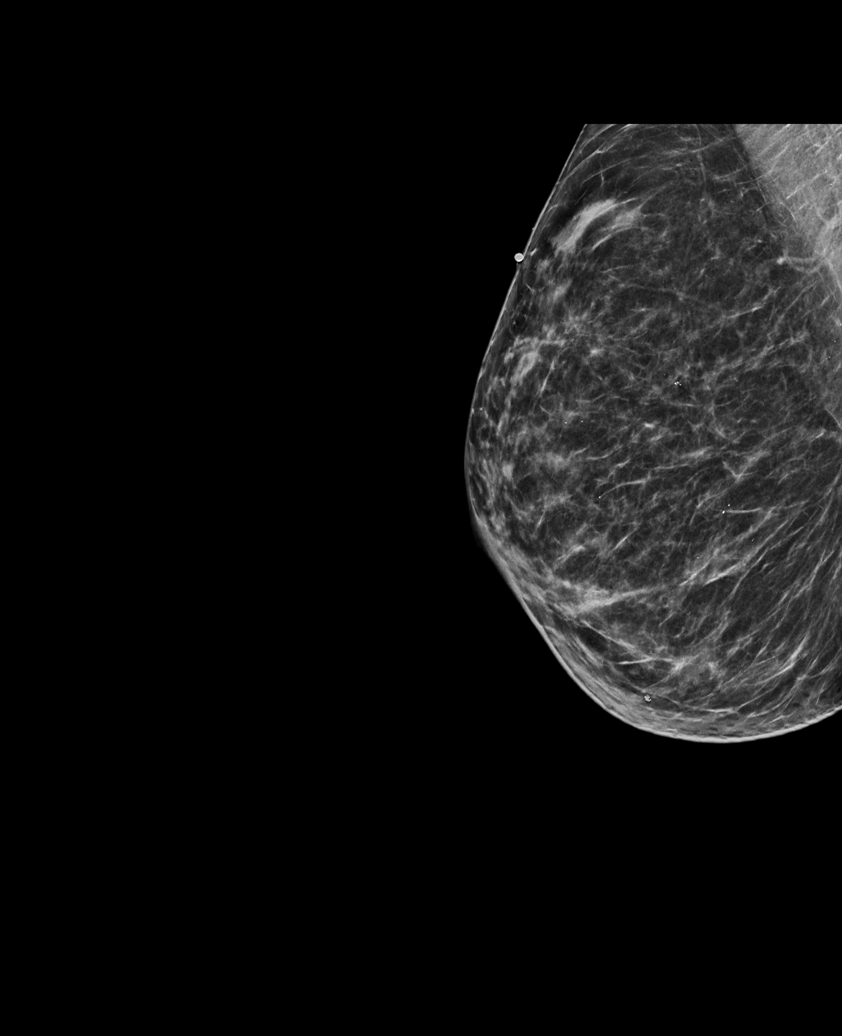

[R MLO synth-2D (2 of 2)]
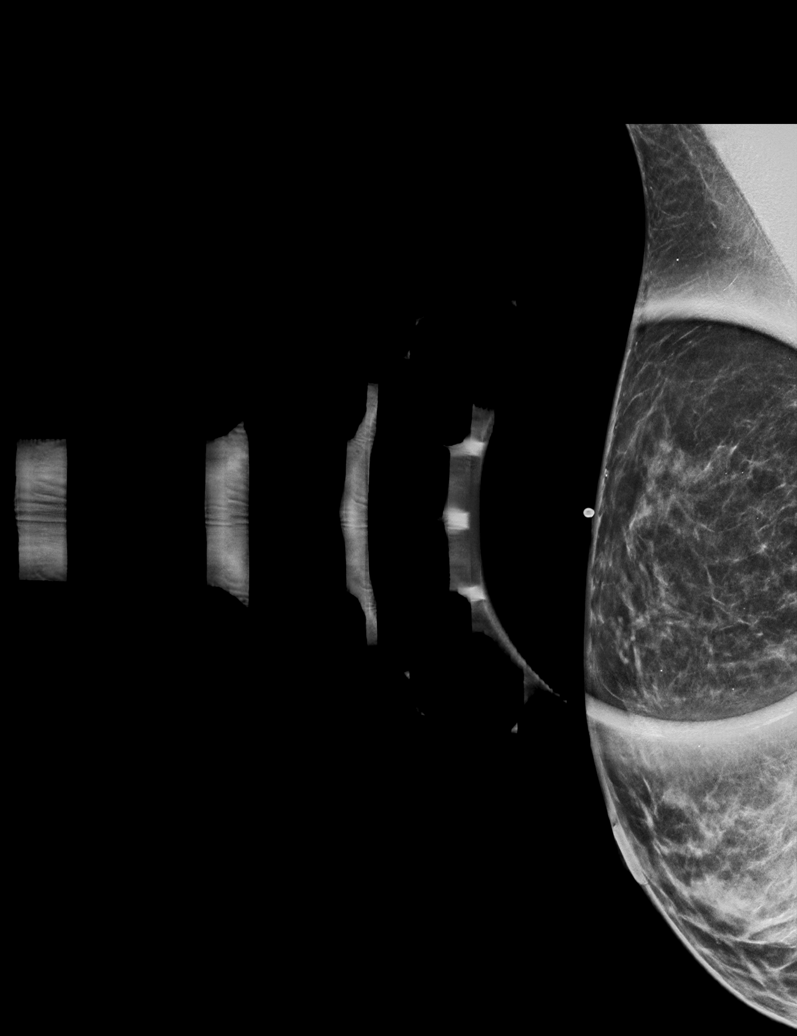

[R MLO tomo (1 of 2) · tomo slice 27/53.0]
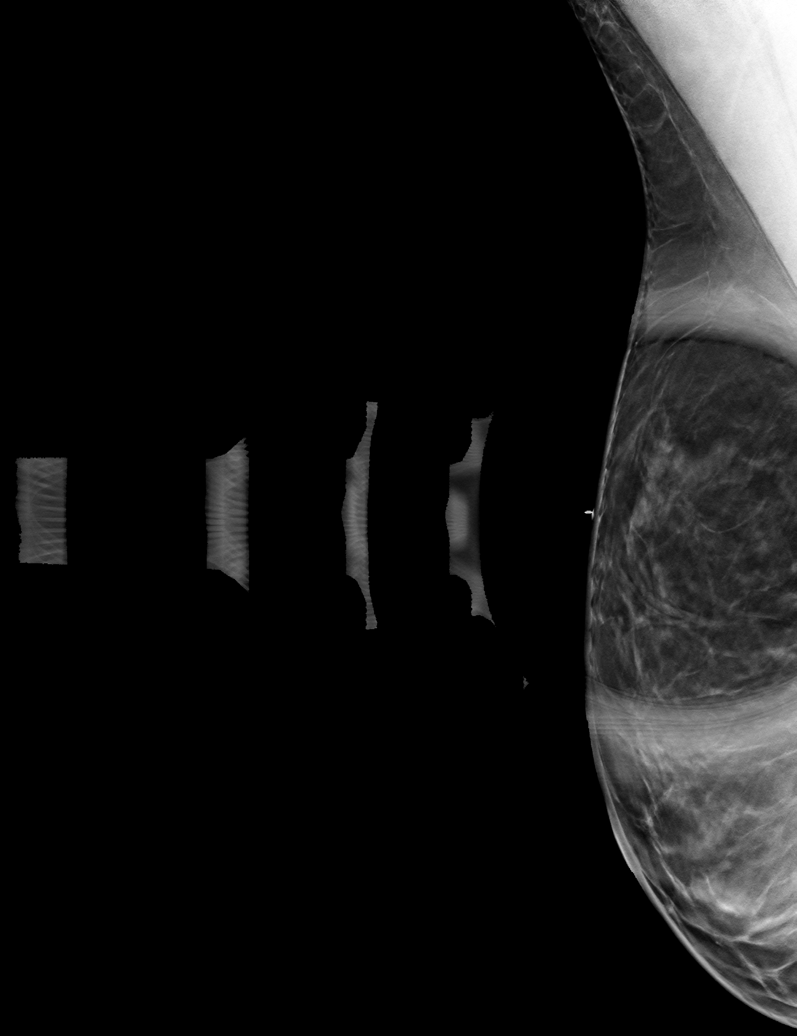

[R MLO tomo (2 of 2) · tomo slice 31/62.0]
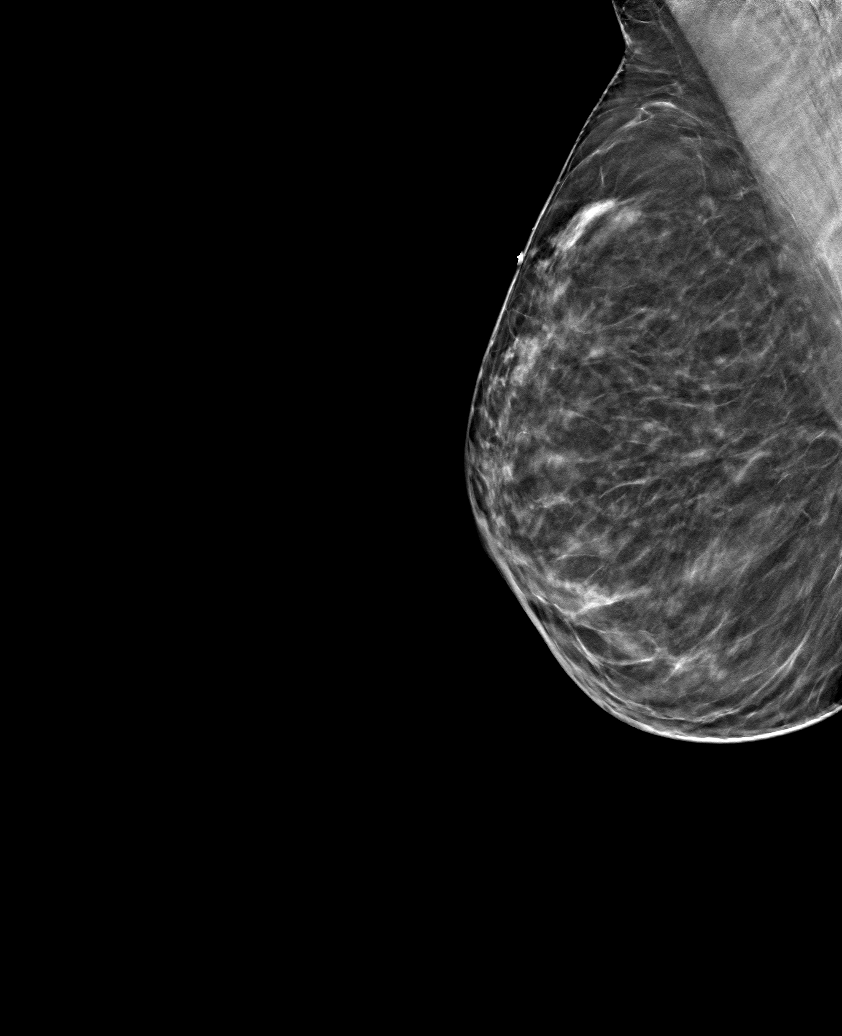

[R CC tomo · tomo slice 27/53.0]
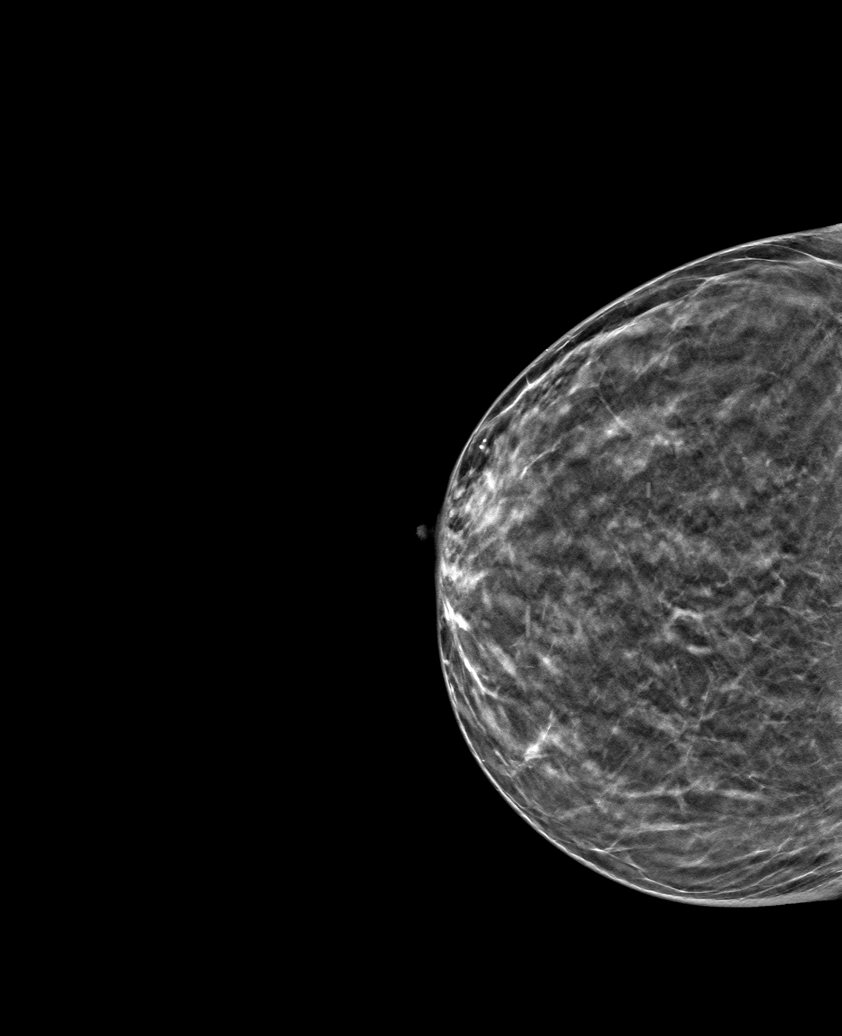

[6 of 18 positions shown; findings below may reference images not displayed]

ACR Breast Density Category b: There are scattered areas of
fibroglandular density.
FINDINGS: No suspicious mass, distortion, or microcalcifications are
identified to suggest presence of malignancy. Spot tangential view
is performed in the 12 o'clock location of the RIGHT breast and
shows normal appearing fibrofatty tissue.

Mammographic images were processed with CAD.

On physical exam, I palpate soft thickening in the 12 o'clock
location of the RIGHT breast.

Targeted ultrasound is performed, showing a normal fibroglandular
ridge in the 12 o'clock location of the RIGHT breast. There has been
interval resolution of small cysts in the 12 o'clock location 7
centimeters from the nipple.
IMPRESSION: No mammographic or ultrasound evidence for malignancy.

RECOMMENDATION:
Screening mammogram at age 40 unless there are persistent or
intervening clinical concerns. (Code:Y2-Z-KW2)

I have discussed the findings and recommendations with the patient.
If applicable, a reminder letter will be sent to the patient
regarding the next appointment.

BI-RADS CATEGORY  1: Negative.
